# Patient Record
Sex: Female | Born: 1935 | Race: White | Hispanic: No | Smoking: Never smoker
Health system: Southern US, Community
[De-identification: ages and names within clinical notes are randomized; demographics above are authoritative.]

## PROBLEM LIST (undated history)

## (undated) DIAGNOSIS — F32A Depression, unspecified: Secondary | ICD-10-CM

## (undated) DIAGNOSIS — E119 Type 2 diabetes mellitus without complications: Secondary | ICD-10-CM

## (undated) DIAGNOSIS — K219 Gastro-esophageal reflux disease without esophagitis: Secondary | ICD-10-CM

## (undated) DIAGNOSIS — N289 Disorder of kidney and ureter, unspecified: Secondary | ICD-10-CM

## (undated) DIAGNOSIS — M810 Age-related osteoporosis without current pathological fracture: Secondary | ICD-10-CM

## (undated) DIAGNOSIS — M1990 Unspecified osteoarthritis, unspecified site: Secondary | ICD-10-CM

## (undated) DIAGNOSIS — F329 Major depressive disorder, single episode, unspecified: Secondary | ICD-10-CM

## (undated) DIAGNOSIS — E785 Hyperlipidemia, unspecified: Secondary | ICD-10-CM

## (undated) DIAGNOSIS — I10 Essential (primary) hypertension: Secondary | ICD-10-CM

## (undated) DIAGNOSIS — C801 Malignant (primary) neoplasm, unspecified: Secondary | ICD-10-CM

## (undated) HISTORY — DX: Unspecified osteoarthritis, unspecified site: M19.90

## (undated) HISTORY — DX: Depression, unspecified: F32.A

## (undated) HISTORY — DX: Hyperlipidemia, unspecified: E78.5

## (undated) HISTORY — DX: Major depressive disorder, single episode, unspecified: F32.9

## (undated) HISTORY — DX: Type 2 diabetes mellitus without complications: E11.9

## (undated) HISTORY — PX: TOTAL HIP ARTHROPLASTY: SHX124

## (undated) HISTORY — DX: Gastro-esophageal reflux disease without esophagitis: K21.9

---

## 2003-11-19 ENCOUNTER — Ambulatory Visit: Payer: Self-pay | Admitting: *Deleted

## 2004-10-09 ENCOUNTER — Ambulatory Visit: Payer: Self-pay | Admitting: Ophthalmology

## 2004-10-14 ENCOUNTER — Ambulatory Visit: Payer: Self-pay | Admitting: Ophthalmology

## 2004-11-23 ENCOUNTER — Ambulatory Visit: Payer: Self-pay | Admitting: Nurse Practitioner

## 2004-11-27 ENCOUNTER — Ambulatory Visit: Payer: Self-pay | Admitting: Nurse Practitioner

## 2005-12-14 ENCOUNTER — Ambulatory Visit: Payer: Self-pay | Admitting: *Deleted

## 2006-08-15 ENCOUNTER — Ambulatory Visit: Payer: Self-pay | Admitting: *Deleted

## 2006-08-31 ENCOUNTER — Ambulatory Visit: Payer: Self-pay | Admitting: *Deleted

## 2006-09-21 ENCOUNTER — Ambulatory Visit: Payer: Self-pay | Admitting: *Deleted

## 2007-01-17 ENCOUNTER — Ambulatory Visit: Payer: Self-pay | Admitting: *Deleted

## 2007-12-25 ENCOUNTER — Ambulatory Visit: Payer: Self-pay | Admitting: Nephrology

## 2008-02-27 ENCOUNTER — Ambulatory Visit: Payer: Self-pay | Admitting: Family Medicine

## 2008-09-06 ENCOUNTER — Inpatient Hospital Stay: Payer: Self-pay | Admitting: Specialist

## 2008-09-13 ENCOUNTER — Encounter: Payer: Self-pay | Admitting: Internal Medicine

## 2008-09-15 ENCOUNTER — Encounter: Payer: Self-pay | Admitting: Internal Medicine

## 2009-03-07 ENCOUNTER — Ambulatory Visit: Payer: Self-pay | Admitting: Family Medicine

## 2009-09-13 ENCOUNTER — Inpatient Hospital Stay: Payer: Self-pay | Admitting: Internal Medicine

## 2010-02-19 ENCOUNTER — Ambulatory Visit: Payer: Self-pay | Admitting: Family Medicine

## 2010-03-11 ENCOUNTER — Ambulatory Visit: Payer: Self-pay | Admitting: Family Medicine

## 2010-09-20 ENCOUNTER — Emergency Department: Payer: Self-pay | Admitting: Internal Medicine

## 2011-04-02 ENCOUNTER — Ambulatory Visit: Payer: Self-pay | Admitting: Family Medicine

## 2013-01-21 ENCOUNTER — Emergency Department: Payer: Self-pay | Admitting: Emergency Medicine

## 2013-02-23 ENCOUNTER — Emergency Department: Payer: Self-pay | Admitting: Emergency Medicine

## 2013-02-23 LAB — CBC
HCT: 36.3 % (ref 35.0–47.0)
HGB: 11.9 g/dL — ABNORMAL LOW (ref 12.0–16.0)
MCH: 31.2 pg (ref 26.0–34.0)
MCHC: 32.8 g/dL (ref 32.0–36.0)
MCV: 95 fL (ref 80–100)
PLATELETS: 215 10*3/uL (ref 150–440)
RBC: 3.82 10*6/uL (ref 3.80–5.20)
RDW: 13.9 % (ref 11.5–14.5)
WBC: 13.1 10*3/uL — AB (ref 3.6–11.0)

## 2013-02-23 LAB — BASIC METABOLIC PANEL
ANION GAP: 7 (ref 7–16)
BUN: 31 mg/dL — ABNORMAL HIGH (ref 7–18)
CHLORIDE: 107 mmol/L (ref 98–107)
CREATININE: 1.35 mg/dL — AB (ref 0.60–1.30)
Calcium, Total: 8.6 mg/dL (ref 8.5–10.1)
Co2: 26 mmol/L (ref 21–32)
EGFR (African American): 42 — ABNORMAL LOW
EGFR (Non-African Amer.): 36 — ABNORMAL LOW
Glucose: 225 mg/dL — ABNORMAL HIGH (ref 65–99)
Osmolality: 293 (ref 275–301)
Potassium: 4.1 mmol/L (ref 3.5–5.1)
SODIUM: 140 mmol/L (ref 136–145)

## 2013-02-23 LAB — TROPONIN I: Troponin-I: <0.02 ng/mL

## 2016-09-16 ENCOUNTER — Other Ambulatory Visit: Payer: Self-pay | Admitting: Family Medicine

## 2016-09-16 DIAGNOSIS — M810 Age-related osteoporosis without current pathological fracture: Secondary | ICD-10-CM

## 2016-10-28 ENCOUNTER — Other Ambulatory Visit: Payer: Self-pay

## 2016-11-22 ENCOUNTER — Encounter: Payer: Self-pay | Admitting: Radiology

## 2016-11-22 ENCOUNTER — Ambulatory Visit
Admission: RE | Admit: 2016-11-22 | Discharge: 2016-11-22 | Disposition: A | Discharge status: Home / Self Care | Payer: Medicare Other | Source: Ambulatory Visit | Attending: Family Medicine | Admitting: Family Medicine

## 2016-11-22 DIAGNOSIS — M47816 Spondylosis without myelopathy or radiculopathy, lumbar region: Secondary | ICD-10-CM | POA: Diagnosis not present

## 2016-11-22 DIAGNOSIS — M4186 Other forms of scoliosis, lumbar region: Secondary | ICD-10-CM | POA: Insufficient documentation

## 2016-11-22 DIAGNOSIS — Z13820 Encounter for screening for osteoporosis: Secondary | ICD-10-CM | POA: Insufficient documentation

## 2016-11-22 DIAGNOSIS — M810 Age-related osteoporosis without current pathological fracture: Secondary | ICD-10-CM | POA: Diagnosis not present

## 2017-08-22 ENCOUNTER — Emergency Department
Admission: EM | Admit: 2017-08-22 | Discharge: 2017-08-23 | Disposition: A | Discharge status: Home / Self Care | Payer: Medicare Other | Source: Home / Self Care

## 2017-08-22 ENCOUNTER — Other Ambulatory Visit: Payer: Self-pay

## 2017-08-22 ENCOUNTER — Emergency Department: Payer: Medicare Other

## 2017-08-22 ENCOUNTER — Encounter: Payer: Self-pay | Admitting: *Deleted

## 2017-08-22 DIAGNOSIS — Y939 Activity, unspecified: Secondary | ICD-10-CM

## 2017-08-22 DIAGNOSIS — Y998 Other external cause status: Secondary | ICD-10-CM

## 2017-08-22 DIAGNOSIS — S42291A Other displaced fracture of upper end of right humerus, initial encounter for closed fracture: Secondary | ICD-10-CM | POA: Insufficient documentation

## 2017-08-22 DIAGNOSIS — J189 Pneumonia, unspecified organism: Secondary | ICD-10-CM | POA: Diagnosis not present

## 2017-08-22 DIAGNOSIS — Y929 Unspecified place or not applicable: Secondary | ICD-10-CM | POA: Insufficient documentation

## 2017-08-22 DIAGNOSIS — R0602 Shortness of breath: Secondary | ICD-10-CM | POA: Diagnosis not present

## 2017-08-22 DIAGNOSIS — W010XXA Fall on same level from slipping, tripping and stumbling without subsequent striking against object, initial encounter: Secondary | ICD-10-CM | POA: Insufficient documentation

## 2017-08-22 NOTE — ED Triage Notes (Signed)
Pt to triage via wheelchair   Pt stood up and fell.  Pt has right upper arm pain.  No loc no vomiting. Pt alert.  Speech clear.

## 2017-08-23 MED ORDER — ACETAMINOPHEN 325 MG PO TABS
650.0000 mg | ORAL_TABLET | Freq: Once | ORAL | Status: AC
  Administered 2017-08-23: 650 mg via ORAL
  Filled 2017-08-23: qty 2

## 2017-08-23 NOTE — Discharge Instructions (Signed)
Please call and schedule an appointment with Dr. Sabra Heck. Take Tylenol every 6 hours as needed for pain. Wear the sling until the follow-up appointment with orthopedics. Return to the emergency department for symptoms that change or worsen if you are unable to schedule an appointment with orthopedics or primary care provider.

## 2017-08-23 NOTE — ED Provider Notes (Signed)
[facility] Emergency Department Provider Note ____________________________________________  Time seen: Approximately 12:34 AM  I have reviewed the triage vital signs and the nursing notes.   HISTORY  Chief Complaint Arm Injury    HPI Katelyn Gregory is a 84 y.o. female who presents to the emergency department for evaluation and treatment of right upper arm pain after mechanical, non-syncopal fall.  She states that she stood up, tripped, and fell.  She denies loss of consciousness.  She has had no vomiting or other symptoms of concern.  No alleviating measures attempted prior to arrival.  No past medical history on file.  There are no active problems to display for this patient.  Prior to Admission medications   Not on File    Allergies Penicillins  No family history on file.  Social History Social History   Tobacco Use  . Smoking status: Never Smoker  . Smokeless tobacco: Never Used  Substance Use Topics  . Alcohol use: Never    Frequency: Never  . Drug use: Never    Review of Systems Constitutional: Negative for fever. Cardiovascular: Negative for chest pain. Respiratory: Negative for shortness of breath. Musculoskeletal: Positive for right forearm pain. Skin: Positive for ecchymosis Neurological: Negative for decrease in sensation  ____________________________________________   PHYSICAL EXAM:  VITAL SIGNS: ED Triage Vitals  Enc Vitals Group     BP 08/22/17 2311 (!) 116/54     Pulse Rate 08/22/17 2311 83     Resp 08/22/17 2311 18     Temp 08/22/17 2311 98 F (36.7 C)     Temp Source 08/22/17 2311 Oral     SpO2 08/22/17 2311 97 %     Weight 08/22/17 2313 125 lb (56.7 kg)     Height 08/22/17 2313 5' (1.524 m)     Head Circumference --      Peak Flow --      Pain Score 08/22/17 2313 8     Pain Loc --      Pain Edu? --      Excl. in [place]? --     Constitutional: Alert and oriented. Well appearing and in no acute  distress. Eyes: Conjunctivae are clear without discharge or drainage Head: Atraumatic Neck: Supple.  No focal midline tenderness on exam. Respiratory: No cough. Respirations are even and unlabored. Musculoskeletal: Focal tenderness of the right upper extremity over the proximal humerus.  Skin is intact. Neurologic: Patient is neurovascularly intact, specifically the right hand and upper extremity. Skin: Ecchymosis is noted over the right upper extremity as well as scattered over bilateral extremities.  There is no open wound or lesion. Psychiatric: Affect and behavior are appropriate.  ____________________________________________   LABS (all labs ordered are listed, but only abnormal results are displayed)  Labs Reviewed - No data to display ____________________________________________  RADIOLOGY  Highly comminuted upper humerus shaft fracture with displacement per radiology. ____________________________________________   PROCEDURES  Procedures  ____________________________________________   INITIAL IMPRESSION / ASSESSMENT AND PLAN / ED COURSE  Katelyn Gregory is a 84 y.o. who presents to the emergency department for treatment and evaluation of right upper extremity pain after mechanical, non-syncopal fall in her home.  She will be placed in a sling and will follow up with orthopedics.  Her family will call tomorrow to schedule that appointment.  While here, she was able to stand and bear weight and denied any pain in her lower extremities or hips.  Family is concerned about her ability to ambulate  at home as she is dependent upon her walker.  They were encouraged to use her wheelchair and a wide-based cane.  There were encouraged to return to the emergency department for symptoms of concern if unable to schedule an appointment with her primary care provider or the orthopedist.   Medications  acetaminophen (TYLENOL) tablet 650 mg (has no administration in time range)     Pertinent labs & imaging results that were available during my care of the patient were reviewed by me and considered in my medical decision making (see chart for details).  _________________________________________   FINAL CLINICAL IMPRESSION(S) / ED DIAGNOSES  Final diagnoses:  Other closed displaced fracture of proximal end of right humerus, initial encounter    ED Discharge Orders    None       If controlled substance prescribed during this visit, 12 month history viewed on the [place] prior to issuing an initial prescription for Schedule II or III opiod.    Victorino Dike, FNP 08/23/17 0147    Darel Hong, MD 08/26/17 1410

## 2017-08-23 NOTE — ED Notes (Signed)
Stood pt up to be sure she could stand with no pain. Pt stated that only her arm hurts

## 2017-08-24 ENCOUNTER — Encounter: Payer: Self-pay | Admitting: Emergency Medicine

## 2017-08-24 ENCOUNTER — Inpatient Hospital Stay
Admission: EM | Admit: 2017-08-24 | Discharge: 2017-08-27 | DRG: 194 | Disposition: A | Discharge status: Skilled Nursing Facility | Payer: Medicare Other | Attending: Internal Medicine | Admitting: Internal Medicine

## 2017-08-24 ENCOUNTER — Emergency Department: Payer: Medicare Other

## 2017-08-24 DIAGNOSIS — D631 Anemia in chronic kidney disease: Secondary | ICD-10-CM | POA: Diagnosis present

## 2017-08-24 DIAGNOSIS — Z880 Allergy status to penicillin: Secondary | ICD-10-CM | POA: Diagnosis not present

## 2017-08-24 DIAGNOSIS — D509 Iron deficiency anemia, unspecified: Secondary | ICD-10-CM | POA: Diagnosis present

## 2017-08-24 DIAGNOSIS — Z7982 Long term (current) use of aspirin: Secondary | ICD-10-CM

## 2017-08-24 DIAGNOSIS — M810 Age-related osteoporosis without current pathological fracture: Secondary | ICD-10-CM | POA: Diagnosis present

## 2017-08-24 DIAGNOSIS — K219 Gastro-esophageal reflux disease without esophagitis: Secondary | ICD-10-CM | POA: Diagnosis present

## 2017-08-24 DIAGNOSIS — S42201A Unspecified fracture of upper end of right humerus, initial encounter for closed fracture: Secondary | ICD-10-CM | POA: Diagnosis present

## 2017-08-24 DIAGNOSIS — Z7984 Long term (current) use of oral hypoglycemic drugs: Secondary | ICD-10-CM

## 2017-08-24 DIAGNOSIS — N179 Acute kidney failure, unspecified: Secondary | ICD-10-CM | POA: Diagnosis present

## 2017-08-24 DIAGNOSIS — N183 Chronic kidney disease, stage 3 (moderate): Secondary | ICD-10-CM | POA: Diagnosis present

## 2017-08-24 DIAGNOSIS — Z79899 Other long term (current) drug therapy: Secondary | ICD-10-CM | POA: Diagnosis not present

## 2017-08-24 DIAGNOSIS — Z85828 Personal history of other malignant neoplasm of skin: Secondary | ICD-10-CM

## 2017-08-24 DIAGNOSIS — Z96649 Presence of unspecified artificial hip joint: Secondary | ICD-10-CM | POA: Diagnosis present

## 2017-08-24 DIAGNOSIS — W19XXXA Unspecified fall, initial encounter: Secondary | ICD-10-CM | POA: Diagnosis present

## 2017-08-24 DIAGNOSIS — Y92009 Unspecified place in unspecified non-institutional (private) residence as the place of occurrence of the external cause: Secondary | ICD-10-CM

## 2017-08-24 DIAGNOSIS — J189 Pneumonia, unspecified organism: Secondary | ICD-10-CM | POA: Diagnosis present

## 2017-08-24 DIAGNOSIS — R627 Adult failure to thrive: Secondary | ICD-10-CM | POA: Diagnosis present

## 2017-08-24 DIAGNOSIS — E1122 Type 2 diabetes mellitus with diabetic chronic kidney disease: Secondary | ICD-10-CM | POA: Diagnosis present

## 2017-08-24 DIAGNOSIS — I129 Hypertensive chronic kidney disease with stage 1 through stage 4 chronic kidney disease, or unspecified chronic kidney disease: Secondary | ICD-10-CM | POA: Diagnosis present

## 2017-08-24 DIAGNOSIS — R0602 Shortness of breath: Secondary | ICD-10-CM | POA: Diagnosis present

## 2017-08-24 DIAGNOSIS — S42309A Unspecified fracture of shaft of humerus, unspecified arm, initial encounter for closed fracture: Secondary | ICD-10-CM

## 2017-08-24 HISTORY — DX: Disorder of kidney and ureter, unspecified: N28.9

## 2017-08-24 HISTORY — DX: Essential (primary) hypertension: I10

## 2017-08-24 HISTORY — DX: Age-related osteoporosis without current pathological fracture: M81.0

## 2017-08-24 HISTORY — DX: Malignant (primary) neoplasm, unspecified: C80.1

## 2017-08-24 LAB — CBC
HCT: 31.5 % — ABNORMAL LOW (ref 35.0–47.0)
HEMOGLOBIN: 10.6 g/dL — AB (ref 12.0–16.0)
MCH: 32.6 pg (ref 26.0–34.0)
MCHC: 33.5 g/dL (ref 32.0–36.0)
MCV: 97.3 fL (ref 80.0–100.0)
PLATELETS: 223 10*3/uL (ref 150–440)
RBC: 3.24 MIL/uL — AB (ref 3.80–5.20)
RDW: 13.1 % (ref 11.5–14.5)
WBC: 19.1 10*3/uL — AB (ref 3.6–11.0)

## 2017-08-24 LAB — BASIC METABOLIC PANEL
Anion gap: 13 (ref 5–15)
BUN: 78 mg/dL — ABNORMAL HIGH (ref 8–23)
CALCIUM: 8.7 mg/dL — AB (ref 8.9–10.3)
CO2: 25 mmol/L (ref 22–32)
CREATININE: 3.18 mg/dL — AB (ref 0.44–1.00)
Chloride: 106 mmol/L (ref 98–111)
GFR, EST AFRICAN AMERICAN: 14 mL/min — AB (ref >60)
GFR, EST NON AFRICAN AMERICAN: 12 mL/min — AB (ref >60)
Glucose, Bld: 215 mg/dL — ABNORMAL HIGH (ref 70–99)
Potassium: 4.1 mmol/L (ref 3.5–5.1)
SODIUM: 144 mmol/L (ref 135–145)

## 2017-08-24 LAB — GLUCOSE, CAPILLARY: Glucose-Capillary: 194 mg/dL — ABNORMAL HIGH (ref 70–99)

## 2017-08-24 LAB — TROPONIN I

## 2017-08-24 MED ORDER — PANTOPRAZOLE SODIUM 40 MG PO TBEC
40.0000 mg | DELAYED_RELEASE_TABLET | Freq: Every day | ORAL | Status: DC
  Administered 2017-08-25 – 2017-08-27 (×3): 40 mg via ORAL
  Filled 2017-08-24 (×3): qty 1

## 2017-08-24 MED ORDER — ACETAMINOPHEN 325 MG PO TABS
650.0000 mg | ORAL_TABLET | Freq: Four times a day (QID) | ORAL | Status: DC | PRN
  Administered 2017-08-25 – 2017-08-26 (×2): 650 mg via ORAL
  Filled 2017-08-24 (×2): qty 2

## 2017-08-24 MED ORDER — LEVOFLOXACIN IN D5W 500 MG/100ML IV SOLN: 500.0000 mg | INTRAVENOUS | Status: DC

## 2017-08-24 MED ORDER — SENNOSIDES-DOCUSATE SODIUM 8.6-50 MG PO TABS: 1.0000 | ORAL_TABLET | Freq: Every evening | ORAL | Status: DC | PRN

## 2017-08-24 MED ORDER — ASPIRIN EC 81 MG PO TBEC
81.0000 mg | DELAYED_RELEASE_TABLET | Freq: Every day | ORAL | Status: DC
  Administered 2017-08-25 – 2017-08-26 (×2): 81 mg via ORAL
  Filled 2017-08-24: qty 1

## 2017-08-24 MED ORDER — INSULIN ASPART 100 UNIT/ML ~~LOC~~ SOLN
0.0000 [IU] | Freq: Every day | SUBCUTANEOUS | Status: DC
Start: 2017-08-24 — End: 2017-08-27

## 2017-08-24 MED ORDER — GLUCOSAMINE-CHONDROITIN-MSM 500-200-150 MG PO TABS: 1.0000 | ORAL_TABLET | Freq: Every day | ORAL | Status: DC

## 2017-08-24 MED ORDER — LORATADINE 10 MG PO TABS
10.0000 mg | ORAL_TABLET | Freq: Every day | ORAL | Status: DC
  Administered 2017-08-25 – 2017-08-27 (×3): 10 mg via ORAL
  Filled 2017-08-24 (×3): qty 1

## 2017-08-24 MED ORDER — ONDANSETRON HCL 4 MG PO TABS
4.0000 mg | ORAL_TABLET | Freq: Four times a day (QID) | ORAL | Status: DC | PRN
  Administered 2017-08-26 – 2017-08-27 (×2): 4 mg via ORAL
  Filled 2017-08-24 (×2): qty 1

## 2017-08-24 MED ORDER — HEPARIN SODIUM (PORCINE) 5000 UNIT/ML IJ SOLN
5000.0000 [IU] | Freq: Three times a day (TID) | INTRAMUSCULAR | Status: DC
Start: 2017-08-24 — End: 2017-08-25
  Administered 2017-08-24 – 2017-08-25 (×2): 5000 [IU] via SUBCUTANEOUS
  Filled 2017-08-24 (×2): qty 1

## 2017-08-24 MED ORDER — HYDROCODONE-ACETAMINOPHEN 5-325 MG PO TABS
1.0000 | ORAL_TABLET | ORAL | Status: DC | PRN
  Administered 2017-08-24: 1 via ORAL
  Filled 2017-08-24: qty 1

## 2017-08-24 MED ORDER — PAROXETINE HCL 20 MG PO TABS
20.0000 mg | ORAL_TABLET | Freq: Every day | ORAL | Status: DC
  Administered 2017-08-25 – 2017-08-27 (×3): 20 mg via ORAL
  Filled 2017-08-24 (×3): qty 1

## 2017-08-24 MED ORDER — ACETAMINOPHEN 650 MG RE SUPP: 650.0000 mg | Freq: Four times a day (QID) | RECTAL | Status: DC | PRN

## 2017-08-24 MED ORDER — LEVOFLOXACIN IN D5W 750 MG/150ML IV SOLN
750.0000 mg | Freq: Once | INTRAVENOUS | Status: AC
  Administered 2017-08-25: 750 mg via INTRAVENOUS
  Filled 2017-08-24: qty 150

## 2017-08-24 MED ORDER — ADULT MULTIVITAMIN W/MINERALS CH
1.0000 | ORAL_TABLET | Freq: Every day | ORAL | Status: DC
  Administered 2017-08-25 – 2017-08-27 (×3): 1 via ORAL
  Filled 2017-08-24 (×3): qty 1

## 2017-08-24 MED ORDER — GLIPIZIDE ER 5 MG PO TB24
5.0000 mg | ORAL_TABLET | Freq: Two times a day (BID) | ORAL | Status: DC
  Administered 2017-08-24 – 2017-08-26 (×5): 5 mg via ORAL
  Filled 2017-08-24 (×6): qty 1

## 2017-08-24 MED ORDER — SODIUM CHLORIDE 0.9 % IV BOLUS
1000.0000 mL | Freq: Once | INTRAVENOUS | Status: AC
  Administered 2017-08-24: 1000 mL via INTRAVENOUS

## 2017-08-24 MED ORDER — ONDANSETRON HCL 4 MG/2ML IJ SOLN: 4.0000 mg | Freq: Four times a day (QID) | INTRAMUSCULAR | Status: DC | PRN

## 2017-08-24 MED ORDER — CALCIUM CARBONATE-VITAMIN D 500-200 MG-UNIT PO TABS
1.0000 | ORAL_TABLET | Freq: Every day | ORAL | Status: DC
  Administered 2017-08-25 – 2017-08-27 (×3): 1 via ORAL
  Filled 2017-08-24 (×3): qty 1

## 2017-08-24 MED ORDER — SIMVASTATIN 20 MG PO TABS
20.0000 mg | ORAL_TABLET | Freq: Every evening | ORAL | Status: DC
  Administered 2017-08-24 – 2017-08-26 (×3): 20 mg via ORAL
  Filled 2017-08-24 (×3): qty 1

## 2017-08-24 MED ORDER — INSULIN ASPART 100 UNIT/ML ~~LOC~~ SOLN
0.0000 [IU] | Freq: Three times a day (TID) | SUBCUTANEOUS | Status: DC
  Administered 2017-08-25 (×2): 2 [IU] via SUBCUTANEOUS
  Administered 2017-08-26: 5 [IU] via SUBCUTANEOUS
  Administered 2017-08-26: 3 [IU] via SUBCUTANEOUS
  Administered 2017-08-27: 5 [IU] via SUBCUTANEOUS
  Filled 2017-08-24 (×4): qty 1

## 2017-08-24 MED ORDER — SODIUM CHLORIDE 0.9 % IV SOLN
INTRAVENOUS | Status: DC
  Administered 2017-08-24 – 2017-08-26 (×3): via INTRAVENOUS

## 2017-08-24 NOTE — ED Notes (Signed)
Attempted to call report at this time. Was unable to give report. Left name and number, awaiting call back.

## 2017-08-24 NOTE — ED Notes (Signed)
Discussed patient's results with Charge, RN.  Charge RN acknowledged.

## 2017-08-24 NOTE — Progress Notes (Signed)
Advanced care plan.  Purpose of the Encounter: CODE STATUS  Parties in Attendance:Patient  Patient's Decision Capacity:Good  Subjective/Patient's story: Presented for fall and pain in right arm Shortness of breath on exertion  Objective/Medical story Has pneumonia and a right humerus fracture   Goals of care determination:  Advance care directives and goals of care discussed with patient and family Patient wants everything done which includes cpr and ventilator if need arises.   CODE STATUS: Full code   Time spent discussing advanced care planning: 16 minutes

## 2017-08-24 NOTE — ED Notes (Signed)
Pt was in recliner. Shuffle walked to stretcher.   Family member states fell on Monday and broke R humeral head. Denies being on narcotic pain medicine. States since fall she has been weak, incontinent. Asking to be placed in rehab d/t not being able to take care of self. States normally independent. States has been walking with walker at home but gets SOB. Lives with son who has CA and is unable to take care of her. Daughter and son-in-law here with pt. They state they live an hour away and are unable to care for her either. Pt is alert and oriented. Has on a brief. No distress noted at this time. Covered with warm blankets.

## 2017-08-24 NOTE — ED Notes (Signed)
X-ray at bedside

## 2017-08-24 NOTE — ED Provider Notes (Signed)
[facility] Emergency Department Provider Note ____________________________________________   First MD Initiated Contact with Patient 08/24/17 1627     (approximate)  I have reviewed the triage vital signs and the nursing notes.   HISTORY  Chief Complaint Placement  HPI Katelyn Gregory is a 84 y.o. female with history of a recent right-sided humerus fracture was presenting with failure to thrive.  Patient has had increased weakness since the fracture, this past Monday.  Decreased p.o. intake as well as well as shortness of breath with exertion.  Denies any chest pain.  Has been wearing a sling to the right upper extremity.  Past Medical History:  Diagnosis Date  . Cancer ([place])    skin  . Diabetes mellitus without complication ([place])   . Hypertension   . Osteoporosis   . Renal disorder     There are no active problems to display for this patient.   Past Surgical History:  Procedure Laterality Date  . TOTAL HIP ARTHROPLASTY      Prior to Admission medications   Medication Sig Start Date End Date Taking? Authorizing Provider  acetaminophen (TYLENOL) 500 MG tablet Take 500-1,000 mg by mouth every 6 (six) hours as needed for mild pain.   Yes [provider]  amLODipine-benazepril (LOTREL) 5-20 MG capsule Take 1 capsule by mouth daily. 06/28/17  Yes [provider]  aspirin EC 81 MG tablet Take 81 mg by mouth daily.   Yes [provider]  calcium-vitamin D (OSCAL WITH D) 500-200 MG-UNIT tablet Take 1 tablet by mouth daily.   Yes [provider]  cetirizine (ZYRTEC) 10 MG tablet Take 10 mg by mouth daily. 06/24/17  Yes [provider]  esomeprazole (NEXIUM) 40 MG capsule Take 40 mg by mouth daily. 07/18/17  Yes [provider]  furosemide (LASIX) 40 MG tablet Take 40-80 mg by mouth See admin instructions. Alternate 40 mg and 80 mg daily. Take 40 mg by mouth every other day. Take 80 mg by mouth every other  day. 08/12/17  Yes [provider]  glipiZIDE (GLUCOTROL XL) 5 MG 24 hr tablet Take 5-10 mg by mouth 2 (two) times daily. Take 5 mg by mouth in the morning. Take 10 mg by mouth in the evening. 08/18/17  Yes [provider]  Glucosamine-Chondroitin-MSM 500-200-150 MG TABS Take 1 tablet by mouth daily.   Yes [provider]  Multiple Vitamin (MULTIVITAMIN) tablet Take 1 tablet by mouth daily.   Yes [provider]  PARoxetine (PAXIL) 20 MG tablet Take 20 mg by mouth daily. 05/31/17  Yes [provider]  simvastatin (ZOCOR) 20 MG tablet Take 20 mg by mouth every evening. for cholesterol 06/28/17  Yes [provider]    Allergies Penicillins  No family history on file.  Social History Social History   Tobacco Use  . Smoking status: Never Smoker  . Smokeless tobacco: Never Used  Substance Use Topics  . Alcohol use: Never    Frequency: Never  . Drug use: Never    Review of Systems  Constitutional: No fever/chills Eyes: No visual changes. ENT: No sore throat. Cardiovascular: Denies chest pain. Respiratory: as above. Gastrointestinal: No abdominal pain.  No nausea, no vomiting.  No diarrhea.  No constipation. Genitourinary: Negative for dysuria. Musculoskeletal: Negative for back pain. Skin: Negative for rash. Neurological: Negative for headaches, focal weakness or numbness.   ____________________________________________   PHYSICAL EXAM:  VITAL SIGNS: ED Triage Vitals [08/24/17 1330]  Enc Vitals Group  BP (!) 101/46     Pulse Rate 91     Resp 17     Temp 97.7 F (36.5 C)     Temp Source Oral     SpO2 97 %     Weight 125 lb (56.7 kg)     Height 5\' 1"  (1.549 m)     Head Circumference      Peak Flow      Pain Score 6     Pain Loc      Pain Edu?      Excl. in [place]?     Constitutional: Alert and oriented. Well appearing and in no acute distress. Eyes: Conjunctivae are normal.  Head: Atraumatic. Nose: No  congestion/rhinnorhea. Mouth/Throat: Mucous membranes are moist.  Neck: No stridor.   Cardiovascular: Normal rate, regular rhythm. Grossly normal heart sounds.   Respiratory: Normal respiratory effort.  No retractions. Lungs CTAB. Gastrointestinal: Soft and nontender. No distention. No CVA tenderness. Musculoskeletal: No lower extremity tenderness nor edema.  No joint effusions.  Patient wearing right upper extremity sling.  Sensation to light touch is intact to the right hand as well as intact strength.  Ecchymosis overlying the right hand.  Neurologic:  Normal speech and language. No gross focal neurologic deficits are appreciated. Skin:  Skin is warm, dry and intact. No rash noted. Psychiatric: Mood and affect are normal. Speech and behavior are normal.  ____________________________________________   LABS (all labs ordered are listed, but only abnormal results are displayed)  Labs Reviewed  BASIC METABOLIC PANEL - Abnormal; Notable for the following components:      Result Value   Glucose, Bld 215 (*)    BUN 78 (*)    Creatinine, Ser 3.18 (*)    Calcium 8.7 (*)    GFR calc non Af Amer 12 (*)    GFR calc Af Amer 14 (*)    All other components within normal limits  CBC - Abnormal; Notable for the following components:   WBC 19.1 (*)    RBC 3.24 (*)    Hemoglobin 10.6 (*)    HCT 31.5 (*)    All other components within normal limits  URINALYSIS, COMPLETE (UACMP) WITH MICROSCOPIC  TROPONIN I  CBG MONITORING, ED   ____________________________________________  EKG  ED ECG REPORT I, Doran Stabler, the attending physician, personally viewed and interpreted this ECG.   Date: 08/24/2017  EKG Time: 1345  Rate: 87  Rhythm: normal sinus rhythm  Axis: Normal  Intervals:none  ST&T Change: No ST segment elevation or depression.  No abnormal T wave inversion.  ____________________________________________  RADIOLOGY  Pending chest  x-ray ____________________________________________   PROCEDURES  Procedure(s) performed:   Procedures  Critical Care performed:   ____________________________________________   INITIAL IMPRESSION / ASSESSMENT AND PLAN / ED COURSE  Pertinent labs & imaging results that were available during my care of the patient were reviewed by me and considered in my medical decision making (see chart for details).  DDX: Dehydration, kidney failure, urinary tract infection, pneumonia, failure to thrive As part of my medical decision making, I reviewed the following data within the electronic MEDICAL RECORD NUMBER Notes from prior ED visits  ----------------------------------------- 5:21 PM on 08/24/2017 -----------------------------------------  Patient with acute renal failure.  Pending urinalysis as well as chest x-ray.  Patient will be admitted to the hospital.  Signed out to Dr. Estanislado Pandy. ____________________________________________   FINAL CLINICAL IMPRESSION(S) / ED DIAGNOSES  Acute renal failure.  Failure to thrive.  NEW MEDICATIONS STARTED DURING THIS VISIT:  New Prescriptions   No medications on file     Note:  This document was prepared using Dragon voice recognition software and may include unintentional dictation errors.     Orbie Pyo, MD 08/24/17 [phone]

## 2017-08-24 NOTE — ED Notes (Signed)
This RN took pt up to 1A to room 136 and gave bedside report to [employer].

## 2017-08-24 NOTE — Progress Notes (Signed)
Pt admitted from ED from home with family to room 136 via stretcher without incident per MD order. Pt is alert and oriented x 3. Pt denies pain. No visible s/sx of distress and no c/o such. Vital signs stable. Rept received from [facility] from ED. Rept given to [employer] on night shift.

## 2017-08-24 NOTE — Progress Notes (Signed)
Pharmacy Antibiotic Note  Katelyn Gregory is a 84 y.o. female admitted on 08/24/2017 with pneumonia. Pharmacy has been consulted for Levaquin dosing.  Plan: Levaquin 750 mg iv once followed by 500 mg iv q 48 hours.   Height: 5\' 1"  (154.9 cm) Weight: 125 lb (56.7 kg) IBW/kg (Calculated) : 47.8  Temp (24hrs), Avg:97.7 F (36.5 C), Min:97.7 F (36.5 C), Max:97.7 F (36.5 C)  Recent Labs  Lab 08/24/17 1340  WBC 19.1*  CREATININE 3.18*    Estimated Creatinine Clearance: 9.2 mL/min (A) (by C-G formula based on SCr of 3.18 mg/dL (H)).    Allergies  Allergen Reactions  . Penicillins Other (See Comments)    Has patient had a PCN reaction causing immediate rash, facial/tongue/throat swelling, SOB or lightheadedness with hypotension: Unknown Has patient had a PCN reaction causing severe rash involving mucus membranes or skin necrosis: Unknown Has patient had a PCN reaction that required hospitalization: Unknown Has patient had a PCN reaction occurring within the last 10 years: Unknown If all of the above answers are "NO", then may proceed with Cephalosporin use.     Antimicrobials this admission: Levaquin 7/10 >>  Dose adjustments this admission:   Microbiology results:   Thank you for allowing pharmacy to be a part of this patient's care.  Ulice Dash D 08/24/2017 6:18 PM

## 2017-08-24 NOTE — ED Triage Notes (Signed)
Patient presents to ED via POV from home with family. Patient lives with her son who is sick with cancer and who is unable to care of her. Patient was seen recently for a fall and sustained a right humeral fracture. Family state before this she was independent but now is more weak and is unable to care for herself. Family is requesting placement to a rehab center.

## 2017-08-24 NOTE — Progress Notes (Signed)
PHARMACIST - PHYSICIAN ORDER COMMUNICATION  CONCERNING: P&T Medication Policy on Herbal Medications  DESCRIPTION:  This patient's order for:  Glucosamine-Chondroitin-MSM 500-200-150 MG TABS has been noted.  This product(s) is classified as an "herbal" or natural product. Due to a lack of definitive safety studies or FDA approval, nonstandard manufacturing practices, plus the potential risk of unknown drug-drug interactions while on inpatient medications, the Pharmacy and Therapeutics Committee does not permit the use of "herbal" or natural products of this type within [facility].   ACTION TAKEN: The pharmacy department is unable to verify this order at this time. Please reevaluate patient's clinical condition at discharge and address if the herbal or natural product(s) should be resumed at that time.  Pernell Dupre, PharmD, BCPS Clinical Pharmacist 08/24/2017 8:23 PM

## 2017-08-24 NOTE — H&P (Signed)
[facility] NAME: Katelyn Gregory    MR#:  [id number]  DATE OF BIRTH:  04/17/1926  DATE OF ADMISSION:  08/24/2017  PRIMARY CARE PHYSICIAN: [facility] [place]   REQUESTING/REFERRING PHYSICIAN:   CHIEF COMPLAINT:   Chief Complaint  Patient presents with  . Placement    HISTORY OF PRESENT ILLNESS: Katelyn Gregory  is a 84 y.o. female with a known history of skin cancer, diabetes mellitus type 2, hypertension, osteoporosis presented to the emergency room for fall and pain in the right arm.  Patient's son also unable to take care of her secondary to his cancer.  Patient recently fell down accidentally and broke her right arm and currently in sling.  She has a right humerus fracture.  Patient also gets short of breath on walking.  She was evaluated in the emergency room chest x-ray revealed pneumonia.  No complaints of any chest pain.  No headache, dizziness and blurry vision.  Hospitalist service was consulted for further care.  PAST MEDICAL HISTORY:   Past Medical History:  Diagnosis Date  . Cancer ([place])    skin  . Diabetes mellitus without complication ([place])   . Hypertension   . Osteoporosis   . Renal disorder     PAST SURGICAL HISTORY:  Past Surgical History:  Procedure Laterality Date  . TOTAL HIP ARTHROPLASTY      SOCIAL HISTORY:  Social History   Tobacco Use  . Smoking status: Never Smoker  . Smokeless tobacco: Never Used  Substance Use Topics  . Alcohol use: Never    Frequency: Never    FAMILY HISTORY: No family history on file.  DRUG ALLERGIES:  Allergies  Allergen Reactions  . Penicillins Other (See Comments)    Has patient had a PCN reaction causing immediate rash, facial/tongue/throat swelling, SOB or lightheadedness with hypotension: Unknown Has patient had a PCN reaction causing severe rash involving mucus membranes or skin necrosis: Unknown Has patient had a PCN reaction that required  hospitalization: Unknown Has patient had a PCN reaction occurring within the last 10 years: Unknown If all of the above answers are "NO", then may proceed with Cephalosporin use.     REVIEW OF SYSTEMS:   CONSTITUTIONAL: No fever,has fatigue and weakness.  EYES: No blurred or double vision.  EARS, NOSE, AND THROAT: No tinnitus or ear pain.  RESPIRATORY: Has occasional cough, shortness of breath,  No wheezing or hemoptysis.  CARDIOVASCULAR: No chest pain, orthopnea, edema.  GASTROINTESTINAL: No nausea, vomiting, diarrhea or abdominal pain.  GENITOURINARY: No dysuria, hematuria.  ENDOCRINE: No polyuria, nocturia,  HEMATOLOGY: No anemia, easy bruising or bleeding SKIN: No rash or lesion. MUSCULOSKELETAL: No joint pain or arthritis.   Has pain right arm NEUROLOGIC: No tingling, numbness, weakness.  PSYCHIATRY: No anxiety or depression.   MEDICATIONS AT HOME:  Prior to Admission medications   Medication Sig Start Date End Date Taking? Authorizing Provider  acetaminophen (TYLENOL) 500 MG tablet Take 500-1,000 mg by mouth every 6 (six) hours as needed for mild pain.   Yes [provider]  amLODipine-benazepril (LOTREL) 5-20 MG capsule Take 1 capsule by mouth daily. 06/28/17  Yes [provider]  aspirin EC 81 MG tablet Take 81 mg by mouth daily.   Yes [provider]  calcium-vitamin D (OSCAL WITH D) 500-200 MG-UNIT tablet Take 1 tablet by mouth daily.   Yes [provider]  cetirizine (ZYRTEC) 10 MG tablet Take 10 mg by mouth  daily. 06/24/17  Yes [provider]  esomeprazole (NEXIUM) 40 MG capsule Take 40 mg by mouth daily. 07/18/17  Yes [provider]  furosemide (LASIX) 40 MG tablet Take 40-80 mg by mouth See admin instructions. Alternate 40 mg and 80 mg daily. Take 40 mg by mouth every other day. Take 80 mg by mouth every other day. 08/12/17  Yes [provider]  glipiZIDE (GLUCOTROL XL) 5 MG 24 hr tablet Take 5-10 mg by mouth  2 (two) times daily. Take 5 mg by mouth in the morning. Take 10 mg by mouth in the evening. 08/18/17  Yes [provider]  Glucosamine-Chondroitin-MSM 500-200-150 MG TABS Take 1 tablet by mouth daily.   Yes [provider]  Multiple Vitamin (MULTIVITAMIN) tablet Take 1 tablet by mouth daily.   Yes [provider]  PARoxetine (PAXIL) 20 MG tablet Take 20 mg by mouth daily. 05/31/17  Yes [provider]  simvastatin (ZOCOR) 20 MG tablet Take 20 mg by mouth every evening. for cholesterol 06/28/17  Yes [provider]      PHYSICAL EXAMINATION:   VITAL SIGNS: Blood pressure (!) 112/52, pulse 87, temperature 97.7 F (36.5 C), temperature source Oral, resp. rate 16, height 5\' 1"  (1.549 m), weight 56.7 kg (125 lb), SpO2 96 %.  GENERAL:  84 y.o.-year-old patient lying in the bed with no acute distress.  EYES: Pupils equal, round, reactive to light and accommodation. No scleral icterus. Extraocular muscles intact.  HEENT: Head atraumatic, normocephalic. Oropharynx and nasopharynx clear.  NECK:  Supple, no jugular venous distention. No thyroid enlargement, no tenderness.  LUNGS: Normal breath sounds bilaterally, rales heard in left lung. No use of accessory muscles of respiration.  CARDIOVASCULAR: S1, S2 normal. No murmurs, rubs, or gallops.  ABDOMEN: Soft, nontender, nondistended. Bowel sounds present. No organomegaly or mass.  EXTREMITIES: No pedal edema, cyanosis, or clubbing.  Right arm sling noted NEUROLOGIC: Cranial nerves II through XII are intact. Muscle strength 5/5 in all extremities. Sensation intact. Gait not checked.  PSYCHIATRIC: The patient is alert and oriented x 3.  SKIN: No obvious rash, lesion, or ulcer.   LABORATORY PANEL:   CBC Recent Labs  Lab 08/24/17 1340  WBC 19.1*  HGB 10.6*  HCT 31.5*  PLT 223  MCV 97.3  MCH 32.6  MCHC 33.5  RDW 13.1    ------------------------------------------------------------------------------------------------------------------  Chemistries  Recent Labs  Lab 08/24/17 1340  NA 144  K 4.1  CL 106  CO2 25  GLUCOSE 215*  BUN 78*  CREATININE 3.18*  CALCIUM 8.7*   ------------------------------------------------------------------------------------------------------------------ estimated creatinine clearance is 9.2 mL/min (A) (by C-G formula based on SCr of 3.18 mg/dL (H)). ------------------------------------------------------------------------------------------------------------------ No results for input(s): TSH, T4TOTAL, T3FREE, THYROIDAB in the last 72 hours.  Invalid input(s): FREET3   Coagulation profile No results for input(s): INR, PROTIME in the last 168 hours. ------------------------------------------------------------------------------------------------------------------- No results for input(s): DDIMER in the last 72 hours. -------------------------------------------------------------------------------------------------------------------  Cardiac Enzymes Recent Labs  Lab 08/24/17 1709  TROPONINI <0.03   ------------------------------------------------------------------------------------------------------------------ Invalid input(s): POCBNP  ---------------------------------------------------------------------------------------------------------------  Urinalysis No results found for: COLORURINE, APPEARANCEUR, LABSPEC, PHURINE, GLUCOSEU, HGBUR, BILIRUBINUR, KETONESUR, PROTEINUR, UROBILINOGEN, NITRITE, LEUKOCYTESUR   RADIOLOGY: Dg Chest 1 View  Result Date: 08/24/2017 CLINICAL DATA:  Weakness, recent fall and humeral fracture. Shortness of breath with exertion. EXAM: CHEST  1 VIEW COMPARISON:  02/23/2013. FINDINGS: Trachea is midline. Heart is enlarged. Thoracic aorta is calcified. Large hiatal hernia. Opacification at the base of the left hemithorax may represent a  combination of pleural fluid and airspace consolidation. Right lung is clear. Partially imaged right humeral neck fracture. Old rib fractures. Osteopenia. IMPRESSION: 1. Opacification at the base of the left hemithorax may be due to a combination of pleural fluid and pneumonia. Followup PA and lateral chest X-ray is recommended in 3-4 weeks following trial of antibiotic therapy to ensure resolution and exclude underlying malignancy. 2.  Aortic atherosclerosis (ICD10-170.0). 3. Large hiatal hernia. Electronically Signed   By: Lorin Picket M.D.   On: 08/24/2017 17:36   Dg Humerus Right  Result Date: 08/22/2017 CLINICAL DATA:  Right upper arm pain after fall from standing EXAM: RIGHT HUMERUS - 2+ VIEW COMPARISON:  None. FINDINGS: Highly comminuted upper humerus shaft with impaction and displacement. Soft tissue contusion seen at the lateral arm. Glenohumeral osteoarthritis with marginal spurring. No visible head fracture. The elbow is located and appears intact. Osteopenia. IMPRESSION: Highly comminuted upper humerus shaft fracture with displacement Electronically Signed   By: Monte Fantasia M.D.   On: 08/22/2017 23:56    EKG: Orders placed or performed during the hospital encounter of 08/24/17  . ED EKG  . ED EKG    IMPRESSION AND PLAN:  84 year old elderly female patient with history of skin cancer, diabetes mellitus type 2, osteoporosis, chronic kidney disease presented to the emergency room for fall, pain in the right arm and shortness of breath.  -Right humerus fracture Continue sling to right arm Orthopedic surgery consultation  -Community-acquired pneumonia Start patient on Levaquin antibiotic  -Acute on chronic kidney disease IV fluid hydration and follow-up renal function  -DVT prophylaxis with subcu heparin  All the records are reviewed and case discussed with ED provider. Management plans discussed with the patient, family and they are in agreement.  CODE STATUS:Full  code    TOTAL TIME TAKING CARE OF THIS PATIENT: 51 minutes.    Saundra Shelling M.D on 08/24/2017 at 7:31 PM  Between 7am to 6pm - Pager - [phone]  After 6pm go to [email or web address] - password [email or web address] [facility]  [facility]  Office  [phone]  CC: Primary care physician; [facility]

## 2017-08-25 ENCOUNTER — Inpatient Hospital Stay: Payer: Medicare Other

## 2017-08-25 ENCOUNTER — Encounter
Admission: RE | Admit: 2017-08-25 | Discharge: 2017-08-25 | Disposition: A | Discharge status: Home / Self Care | Payer: Medicare Other | Source: Ambulatory Visit | Attending: Internal Medicine | Admitting: Internal Medicine

## 2017-08-25 ENCOUNTER — Other Ambulatory Visit: Payer: Self-pay

## 2017-08-25 LAB — BASIC METABOLIC PANEL
Anion gap: 8 (ref 5–15)
BUN: 79 mg/dL — ABNORMAL HIGH (ref 8–23)
CHLORIDE: 111 mmol/L (ref 98–111)
CO2: 26 mmol/L (ref 22–32)
Calcium: 8.1 mg/dL — ABNORMAL LOW (ref 8.9–10.3)
Creatinine, Ser: 2.88 mg/dL — ABNORMAL HIGH (ref 0.44–1.00)
GFR calc non Af Amer: 14 mL/min — ABNORMAL LOW (ref >60)
GFR, EST AFRICAN AMERICAN: 16 mL/min — AB (ref >60)
Glucose, Bld: 188 mg/dL — ABNORMAL HIGH (ref 70–99)
POTASSIUM: 3.9 mmol/L (ref 3.5–5.1)
SODIUM: 145 mmol/L (ref 135–145)

## 2017-08-25 LAB — GLUCOSE, CAPILLARY
GLUCOSE-CAPILLARY: 130 mg/dL — AB (ref 70–99)
Glucose-Capillary: 89 mg/dL (ref 70–99)
Glucose-Capillary: 123 mg/dL — ABNORMAL HIGH (ref 70–99)
Glucose-Capillary: 163 mg/dL — ABNORMAL HIGH (ref 70–99)

## 2017-08-25 LAB — URINALYSIS, COMPLETE (UACMP) WITH MICROSCOPIC
Bilirubin Urine: NEGATIVE
GLUCOSE, UA: NEGATIVE mg/dL
HGB URINE DIPSTICK: NEGATIVE
KETONES UR: NEGATIVE mg/dL
Nitrite: NEGATIVE
Protein, ur: 30 mg/dL — AB
Specific Gravity, Urine: 1.013 (ref 1.005–1.030)
WBC, UA: >50 WBC/hpf — ABNORMAL HIGH (ref 0–5)
pH: 5 (ref 5.0–8.0)

## 2017-08-25 LAB — VITAMIN B12: VITAMIN B 12: 550 pg/mL (ref 180–914)

## 2017-08-25 LAB — IRON AND TIBC
IRON: 24 ug/dL — AB (ref 28–170)
Saturation Ratios: 13 % (ref 10.4–31.8)
TIBC: 181 ug/dL — AB (ref 250–450)
UIBC: 157 ug/dL

## 2017-08-25 LAB — CBC
HEMATOCRIT: 24.5 % — AB (ref 35.0–47.0)
HEMOGLOBIN: 8.3 g/dL — AB (ref 12.0–16.0)
MCH: 33.2 pg (ref 26.0–34.0)
MCHC: 33.9 g/dL (ref 32.0–36.0)
MCV: 97.8 fL (ref 80.0–100.0)
Platelets: 152 10*3/uL (ref 150–440)
RBC: 2.51 MIL/uL — AB (ref 3.80–5.20)
RDW: 13.3 % (ref 11.5–14.5)
WBC: 9 10*3/uL (ref 3.6–11.0)

## 2017-08-25 LAB — RETICULOCYTES
RBC.: 2.53 MIL/uL — ABNORMAL LOW (ref 3.80–5.20)
Retic Count, Absolute: 50.6 10*3/uL (ref 19.0–183.0)
Retic Ct Pct: 2 % (ref 0.4–3.1)

## 2017-08-25 LAB — FOLATE: Folate: 61.2 ng/mL (ref >5.9)

## 2017-08-25 LAB — FERRITIN: Ferritin: 54 ng/mL (ref 11–307)

## 2017-08-25 MED ORDER — SODIUM CHLORIDE 0.9 % IV BOLUS: 250.0000 mL | Freq: Once | INTRAVENOUS | Status: DC

## 2017-08-25 MED ORDER — DOXYCYCLINE HYCLATE 100 MG PO TABS
100.0000 mg | ORAL_TABLET | Freq: Two times a day (BID) | ORAL | Status: DC
Start: 2017-08-25 — End: 2017-08-26
  Administered 2017-08-25 – 2017-08-26 (×3): 100 mg via ORAL
  Filled 2017-08-25 (×3): qty 1

## 2017-08-25 MED ORDER — TRAMADOL HCL 50 MG PO TABS
50.0000 mg | ORAL_TABLET | Freq: Four times a day (QID) | ORAL | Status: DC | PRN
  Administered 2017-08-25 – 2017-08-26 (×5): 50 mg via ORAL
  Filled 2017-08-25 (×5): qty 1

## 2017-08-25 MED ORDER — HEPARIN SODIUM (PORCINE) 5000 UNIT/ML IJ SOLN
5000.0000 [IU] | Freq: Two times a day (BID) | INTRAMUSCULAR | Status: DC
Start: 2017-08-25 — End: 2017-08-27
  Administered 2017-08-25 – 2017-08-27 (×5): 5000 [IU] via SUBCUTANEOUS
  Filled 2017-08-25 (×5): qty 1

## 2017-08-25 MED ORDER — ENOXAPARIN SODIUM 40 MG/0.4ML ~~LOC~~ SOLN: 40.0000 mg | SUBCUTANEOUS | Status: DC

## 2017-08-25 NOTE — Evaluation (Signed)
Physical Therapy Evaluation Patient Details Name: Katelyn Gregory MRN: [id number] DOB: 1926/09/14 Today's Date: 08/25/2017   History of Present Illness  Katelyn Gregory  is a 84 y.o. female with a known history of skin cancer, diabetes mellitus type 2, hypertension, osteoporosis presented to the emergency room for fall and pain in the right arm.  Patient's son also unable to take care of her secondary to his cancer.  Patient recently fell down accidentally and broke her right arm and currently in sling.  She has a right humerus fracture.  Patient also gets short of breath on walking.  She was evaluated in the emergency room chest x-ray revealed pneumonia.  No complaints of any chest pain.  No headache, dizziness and blurry vision.  Hospitalist service was consulted for further care.  Clinical Impression  Pt presents with generalized trunk and LE weakness and UE pain secondary to R humeral fracture. Pt is able to show LE muscle activation against gravity in all major muscle groups but is unable to perform supine to sit, sit to stand and bed to chair transfer without max assist 2+. Pt is unable to maintain standing due to fatigue. Additionally, Pt has severe forward trunk lean and spinal kyphosis. Pt reports fear of falling with all activity. Would benefit from skilled PT to address above deficits and promote optimal return to PLOF. Recommend transition to SNF upon discharge from acute hospitalization.    Follow Up Recommendations SNF    Equipment Recommendations  Wheelchair (measurements PT)    Recommendations for Other Services OT consult     Precautions / Restrictions Precautions Precautions: Fall Precaution Comments: RUE  Restrictions Weight Bearing Restrictions: Yes RUE Weight Bearing: Non weight bearing      Mobility  Bed Mobility Overal bed mobility: Needs Assistance Bed Mobility: Supine to Sit     Supine to sit: +2 for physical assistance;Max assist     General bed mobility  comments: Pt bed mobility limited by RUE immobilization as well as lack of trunk control and strength.   Transfers Overall transfer level: Needs assistance   Transfers: Sit to/from Stand;Lateral/Scoot Transfers Sit to Stand: +2 physical assistance;Max assist        Lateral/Scoot Transfers: +2 physical assistance;Max assist General transfer comment: Pt fear of falling or "being dropped" limits participation in transfers.   Ambulation/Gait             General Gait Details: Mabulaiton unable to be assessed due to pt inability to tolerate standing.   Stairs            Wheelchair Mobility    Modified Rankin (Stroke Patients Only)       Balance Overall balance assessment: Needs assistance Sitting-balance support: Bilateral upper extremity supported;Feet supported Sitting balance-Leahy Scale: Poor Sitting balance - Comments: Able to maintain sitting balance without UE support for brief periods but occasional therpaist cueing needed for maintaining position.    Standing balance support: Bilateral upper extremity supported Standing balance-Leahy Scale: Zero Standing balance comment: Pt is unable to maintain standing without therapists support. Demonstrates ant lean secondary to postural issues and decreased hip ext. Pt fear also limiting factor.                              Pertinent Vitals/Pain      Home Living Family/patient expects to be discharged to:: Private residence Living Arrangements: Children Available Help at Discharge: Family Type of Home: House Home Access: Other (  comment)(Pt states she has been instructed to not use stairs and does not do so. Does not specify number of stairs in house. )     Home Layout: Multi-level        Prior Function Level of Independence: Needs assistance   Gait / Transfers Assistance Needed: Ambulated with walker, and not able to navigate stairs without assist.   ADL's / Homemaking Assistance Needed: Son helped  with household activities        Hand Dominance   Dominant Hand: Right    Extremity/Trunk Assessment   Upper Extremity Assessment Upper Extremity Assessment: RUE deficits/detail;LUE deficits/detail RUE Deficits / Details: R humeral fracture, immobilzed in sling, expresses pain with movement  RUE: Unable to fully assess due to pain RUE Sensation: WNL LUE Deficits / Details: L UE at least 3/5 in all major muscle groups. Decreased mobility in OH reaching and shoulder abd.  LUE Sensation: WNL    Lower Extremity Assessment Lower Extremity Assessment: Generalized weakness(BLE show at least 3/5 strength in all major muscle groups, however functionally unable to stand for more than 3-5 sec before fatigue leadiing to need to sit. )    Cervical / Trunk Assessment Cervical / Trunk Assessment: Kyphotic;Other exceptions Cervical / Trunk Exceptions: Excessive T-spine and L-spine kyphosis, with compensentory c-spine lordosis. Pt unable to reposition to normal spinal posture with verbal and physical cueing.   Communication   Communication: Other (comment)(Hearing loss)  Cognition Arousal/Alertness: Awake/alert Behavior During Therapy: WFL for tasks assessed/performed Overall Cognitive Status: Within Functional Limits for tasks assessed                                        General Comments      Exercises Other Exercises Other Exercises: Bed mobility: Supine to sit-max assist 2+. Transfers: Slide transfer to recliner, max assist 2+; sit to stand max assist 2+   Assessment/Plan    PT Assessment Patient needs continued PT services  PT Problem List Decreased strength;Decreased mobility;Decreased range of motion;Decreased activity tolerance;Decreased balance;Pain       PT Treatment Interventions Therapeutic activities;Therapeutic exercise;Balance training;Functional mobility training;Patient/family education;Wheelchair mobility training;DME instruction;Gait training     PT Goals (Current goals can be found in the Care Plan section)       Frequency 7X/week   Barriers to discharge        Co-evaluation               AM-PAC PT "6 Clicks" Daily Activity  Outcome Measure Difficulty turning over in bed (including adjusting bedclothes, sheets and blankets)?: Unable Difficulty moving from lying on back to sitting on the side of the bed? : Unable Difficulty sitting down on and standing up from a chair with arms (e.g., wheelchair, bedside commode, etc,.)?: Unable Help needed moving to and from a bed to chair (including a wheelchair)?: Total Help needed walking in hospital room?: Total Help needed climbing 3-5 steps with a railing? : Total 6 Click Score: 6    End of Session Equipment Utilized During Treatment: Gait belt Activity Tolerance: Treatment limited secondary to medical complications (Comment);Patient limited by fatigue(Pt's generalized weakness and RUE immobilization limited intervention. ) Patient left: in chair;with chair alarm set;with call bell/phone within reach Nurse Communication: Mobility status(Relayed pt's transfer level and inablity to tolerate standing. ) PT Visit Diagnosis: Unsteadiness on feet (R26.81);Muscle weakness (generalized) (M62.81);Adult, failure to thrive (R62.7)    Time: 2694-8546 PT  Time Calculation (min) (ACUTE ONLY): 30 min   Charges:   PT Evaluation $PT Eval Moderate Complexity: 1 Mod PT Treatments $Therapeutic Activity: 8-22 mins   PT G Codes:        Hortencia Conradi, SPT 08/25/17,4:08 PM

## 2017-08-25 NOTE — Clinical Social Work Placement (Signed)
   CLINICAL SOCIAL WORK PLACEMENT  NOTE  Date:  08/25/2017  Patient Details  Name: Katelyn Gregory MRN: [id number] Date of Birth: 02/28/1926  Clinical Social Work is seeking post-discharge placement for this patient at the [place] level of care (*CSW will initial, date and re-position this form in  chart as items are completed):  Yes   Patient/family provided with [place] Work Department's list of facilities offering this level of care within the geographic area requested by the patient (or if unable, by the patient's family).  Yes   Patient/family informed of their freedom to choose among providers that offer the needed level of care, that participate in Medicare, Medicaid or managed care program needed by the patient, have an available bed and are willing to accept the patient.  Yes   Patient/family informed of [place]'s ownership interest in [facility] and [facility], as well as of the fact that they are under no obligation to receive care at these facilities.  PASRR submitted to EDS on       PASRR number received on       Existing PASRR number confirmed on 08/25/17     FL2 transmitted to all facilities in geographic area requested by pt/family on 08/25/17     FL2 transmitted to all facilities within larger geographic area on       Patient informed that his/her managed care company has contracts with or will negotiate with certain facilities, including the following:        Yes   Patient/family informed of bed offers received.  Patient chooses bed at [facility] )     Physician recommends and patient chooses bed at      Patient to be transferred to   on  .  Patient to be transferred to facility by       Patient family notified on   of transfer.  Name of family member notified:        PHYSICIAN       Additional Comment:    _______________________________________________ Anastacio Bua, Veronia Beets, LCSW 08/25/2017, 11:36  AM

## 2017-08-25 NOTE — Progress Notes (Addendum)
[facility] [place] NAME: Katelyn Gregory    MR#:  [id number]  DATE OF BIRTH:  [birth year]  SUBJECTIVE:  CHIEF COMPLAINT:   Chief Complaint  Patient presents with  . Placement   -Patient admitted with weakness and falls and noted to have left-sided pneumonia -Recent right humeral fracture, in a sling.  Blood pressure is low normal  REVIEW OF SYSTEMS:  Review of Systems  Constitutional: Negative for chills and fever.  HENT: Positive for hearing loss.   Eyes: Negative for blurred vision and double vision.  Respiratory: Positive for cough. Negative for shortness of breath and wheezing.   Cardiovascular: Negative for chest pain and palpitations.  Gastrointestinal: Negative for abdominal pain, constipation, diarrhea, nausea and vomiting.  Genitourinary: Negative for dysuria.  Musculoskeletal: Positive for falls and joint pain.  Neurological: Negative for dizziness, focal weakness, seizures, weakness and headaches.  Psychiatric/Behavioral: Negative for depression.    DRUG ALLERGIES:   Allergies  Allergen Reactions  . Penicillins Other (See Comments)    Has patient had a PCN reaction causing immediate rash, facial/tongue/throat swelling, SOB or lightheadedness with hypotension: Unknown Has patient had a PCN reaction causing severe rash involving mucus membranes or skin necrosis: Unknown Has patient had a PCN reaction that required hospitalization: Unknown Has patient had a PCN reaction occurring within the last 10 years: Unknown If all of the above answers are "NO", then may proceed with Cephalosporin use.     VITALS:  Blood pressure (!) 95/52, pulse 74, temperature 97.8 F (36.6 C), resp. rate 15, height 5\' 1"  (1.549 m), weight 56.7 kg (125 lb), SpO2 97 %.  PHYSICAL EXAMINATION:  Physical Exam  GENERAL:  84 y.o.-year-old elderly patient lying in the bed with no acute distress.  EYES: Pupils equal, round, reactive to light and  accommodation. No scleral icterus. Extraocular muscles intact.  HEENT: Head atraumatic, normocephalic. Oropharynx and nasopharynx clear.  NECK:  Supple, no jugular venous distention. No thyroid enlargement, no tenderness.  LUNGS: Normal breath sounds bilaterally, no wheezing, rales,rhonchi or crepitation. No use of accessory muscles of respiration. Decreased bibasilar breath sounds CARDIOVASCULAR: S1, S2 normal. No  rubs, or gallops. 2/6 systolic murmur present ABDOMEN: Soft, nontender, nondistended. Bowel sounds present. No organomegaly or mass.  EXTREMITIES: right arm in a sling. No pedal edema, cyanosis, or clubbing.  NEUROLOGIC: Cranial nerves II through XII are intact. Muscle strength 5/5 in all extremities. Sensation intact. Gait not checked. Global weakness noted. PSYCHIATRIC: The patient is alert and oriented x 3.  SKIN: No obvious rash, lesion, or ulcer.    LABORATORY PANEL:   CBC Recent Labs  Lab 08/25/17 0437  WBC 9.0  HGB 8.3*  HCT 24.5*  PLT 152   ------------------------------------------------------------------------------------------------------------------  Chemistries  Recent Labs  Lab 08/25/17 0437  NA 145  K 3.9  CL 111  CO2 26  GLUCOSE 188*  BUN 79*  CREATININE 2.88*  CALCIUM 8.1*   ------------------------------------------------------------------------------------------------------------------  Cardiac Enzymes Recent Labs  Lab 08/24/17 1709  TROPONINI <0.03   ------------------------------------------------------------------------------------------------------------------  RADIOLOGY:  Dg Chest 1 View  Result Date: 08/24/2017 CLINICAL DATA:  Weakness, recent fall and humeral fracture. Shortness of breath with exertion. EXAM: CHEST  1 VIEW COMPARISON:  02/23/2013. FINDINGS: Trachea is midline. Heart is enlarged. Thoracic aorta is calcified. Large hiatal hernia. Opacification at the base of the left hemithorax may represent a combination of  pleural fluid and airspace consolidation. Right lung is clear. Partially imaged right humeral neck fracture.  Old rib fractures. Osteopenia. IMPRESSION: 1. Opacification at the base of the left hemithorax may be due to a combination of pleural fluid and pneumonia. Followup PA and lateral chest X-ray is recommended in 3-4 weeks following trial of antibiotic therapy to ensure resolution and exclude underlying malignancy. 2.  Aortic atherosclerosis (ICD10-170.0). 3. Large hiatal hernia. Electronically Signed   By: Lorin Picket M.D.   On: 08/24/2017 17:36    EKG:   Orders placed or performed during the hospital encounter of 08/24/17  . ED EKG  . ED EKG    ASSESSMENT AND PLAN:   84 year old female with past medical history significant for hypertension, non-insulin-dependent diabetes mellitus and osteoporosis who is independent at baseline presents to hospital secondary to a fall and weakness  1.  Fall and weakness-fall and ER visit showing right humeral fracture, was discharged home-but unable to care for herself and is brought back. -Pain control, physical therapy consult -Orthopedics has been consulted -Check urine analysis to rule out infection  2.  Community-acquired pneumonia-follow blood cultures -Patient is on Levaquin as she is allergic to penicillins.  Unknown reaction and patient is not aware -We will change that to doxycycline given tendon issues with levaquin  3.  Diabetes mellitus-on glipizide and sliding scale insulin  4.  Anemia-drop in hemoglobin by 2 points, likely dilutional.  Check anemia panel.  No active bleeding at this time.  Unsure if it this is acute or chronic.  No outpatient labs available to compare at this time.  5.  GERD-Protonix  6.  DVT prophylaxis-Lovenox  Physical therapy consulted     All the records are reviewed and case discussed with Care Management/Social Workerr. Management plans discussed with the patient, family and they are in  agreement.  CODE STATUS: Full Code  TOTAL TIME TAKING CARE OF THIS PATIENT: 38 minutes.   POSSIBLE D/C IN 1-2 DAYS, DEPENDING ON CLINICAL CONDITION.   Gladstone Lighter M.D on 08/25/2017 at 8:57 AM  Between 7am to 6pm - Pager - [phone]  After 6pm go to [email or web address] - password [email or web address] [facility]  Office  [phone]  CC: Primary care physician; Center, [facility]

## 2017-08-25 NOTE — Progress Notes (Signed)
Lovenox changed to heparin SQ 5000 units q 12 hours for CrCl <15 and BMI <40 per protocol.

## 2017-08-25 NOTE — Consult Note (Signed)
ORTHOPAEDIC CONSULTATION  REQUESTING PHYSICIAN: Gladstone Lighter, MD  Chief Complaint:   R shoulder pain  History of Present Illness: Katelyn Gregory is a 84 y.o. femalewho had a fall at home 2 days ago. She went to stand up, tripped, and had a fall.  She noted immediate right shoulder pain.  She was seen in the ED that day. X-rays showed a R proximal humerus fracture with significant comminution. She was placed in a sling and discharged home.  She normally lives with her son, but he is sick with cancer and was unable to care for her.  She represented to the emergency room the next day to be admitted for placement.   The patient ambulates with a walker at baseline.  She cannot use the walker currently due to her right shoulder pain.  Pain is described as sharp at its worst and a dull ache at its best.  Pain is rated an 8/10 in severity.  Pain is improved with rest and immobilization.  Pain is worse with any sort of movement.   Of note, she was discovered to have pneumonia as well as acute renal failure in the setting of chronic kidney disease.  She also has a history of diabetes and osteoporosis.  Past Medical History:  Diagnosis Date  . Cancer ([place])    skin  . Diabetes mellitus without complication ([place])   . Hypertension   . Osteoporosis   . Renal disorder    Past Surgical History:  Procedure Laterality Date  . TOTAL HIP ARTHROPLASTY     Social History   Socioeconomic History  . Marital status: Widowed    Spouse name: Not on file  . Number of children: Not on file  . Years of education: Not on file  . Highest education level: Not on file  Occupational History    Employer: RETIRED  Social Needs  . Financial resource strain: Not on file  . Food insecurity:    Worry: Not on file    Inability: Not on file  . Transportation needs:    Medical: Not on file    Non-medical: Not on file  Tobacco Use  . Smoking status:  Never Smoker  . Smokeless tobacco: Never Used  Substance and Sexual Activity  . Alcohol use: Never    Frequency: Never  . Drug use: Never  . Sexual activity: Not on file  Lifestyle  . Physical activity:    Days per week: Not on file    Minutes per session: Not on file  . Stress: Not on file  Relationships  . Social connections:    Talks on phone: Not on file    Gets together: Not on file    Attends religious service: Not on file    Active member of club or organization: Not on file    Attends meetings of clubs or organizations: Not on file    Relationship status: Not on file  Other Topics Concern  . Not on file  Social History Narrative  . Not on file   No family history on file. Allergies  Allergen Reactions  . Penicillins Other (See Comments)    Has patient had a PCN reaction causing immediate rash, facial/tongue/throat swelling, SOB or lightheadedness with hypotension: Unknown Has patient had a PCN reaction causing severe rash involving mucus membranes or skin necrosis: Unknown Has patient had a PCN reaction that required hospitalization: Unknown Has patient had a PCN reaction occurring within the last 10 years: Unknown If all of  the above answers are "NO", then may proceed with Cephalosporin use.    Prior to Admission medications   Medication Sig Start Date End Date Taking? Authorizing Provider  acetaminophen (TYLENOL) 500 MG tablet Take 500-1,000 mg by mouth every 6 (six) hours as needed for mild pain.   Yes [provider]  amLODipine-benazepril (LOTREL) 5-20 MG capsule Take 1 capsule by mouth daily. 06/28/17  Yes [provider]  aspirin EC 81 MG tablet Take 81 mg by mouth daily.   Yes [provider]  calcium-vitamin D (OSCAL WITH D) 500-200 MG-UNIT tablet Take 1 tablet by mouth daily.   Yes [provider]  cetirizine (ZYRTEC) 10 MG tablet Take 10 mg by mouth daily. 06/24/17  Yes [provider]  esomeprazole (NEXIUM) 40  MG capsule Take 40 mg by mouth daily. 07/18/17  Yes [provider]  furosemide (LASIX) 40 MG tablet Take 40-80 mg by mouth See admin instructions. Alternate 40 mg and 80 mg daily. Take 40 mg by mouth every other day. Take 80 mg by mouth every other day. 08/12/17  Yes [provider]  glipiZIDE (GLUCOTROL XL) 5 MG 24 hr tablet Take 5-10 mg by mouth 2 (two) times daily. Take 5 mg by mouth in the morning. Take 10 mg by mouth in the evening. 08/18/17  Yes [provider]  Glucosamine-Chondroitin-MSM 500-200-150 MG TABS Take 1 tablet by mouth daily.   Yes [provider]  Multiple Vitamin (MULTIVITAMIN) tablet Take 1 tablet by mouth daily.   Yes [provider]  PARoxetine (PAXIL) 20 MG tablet Take 20 mg by mouth daily. 05/31/17  Yes [provider]  simvastatin (ZOCOR) 20 MG tablet Take 20 mg by mouth every evening. for cholesterol 06/28/17  Yes [provider]   Recent Labs    08/24/17 1340 08/25/17 0437  WBC 19.1* 9.0  HGB 10.6* 8.3*  HCT 31.5* 24.5*  PLT 223 152  K 4.1 3.9  CL 106 111  CO2 25 26  BUN 78* 79*  CREATININE 3.18* 2.88*  GLUCOSE 215* 188*  CALCIUM 8.7* 8.1*   Dg Chest 1 View  Result Date: 08/24/2017 CLINICAL DATA:  Weakness, recent fall and humeral fracture. Shortness of breath with exertion. EXAM: CHEST  1 VIEW COMPARISON:  02/23/2013. FINDINGS: Trachea is midline. Heart is enlarged. Thoracic aorta is calcified. Large hiatal hernia. Opacification at the base of the left hemithorax may represent a combination of pleural fluid and airspace consolidation. Right lung is clear. Partially imaged right humeral neck fracture. Old rib fractures. Osteopenia. IMPRESSION: 1. Opacification at the base of the left hemithorax may be due to a combination of pleural fluid and pneumonia. Followup PA and lateral chest X-ray is recommended in 3-4 weeks following trial of antibiotic therapy to ensure resolution and exclude underlying  malignancy. 2.  Aortic atherosclerosis (ICD10-170.0). 3. Large hiatal hernia. Electronically Signed   By: Lorin Picket M.D.   On: 08/24/2017 17:36   Dg Shoulder Right  Result Date: 08/25/2017 CLINICAL DATA:  In-patient RIGHT humeral fracture. EXAM: RIGHT SHOULDER - 2+ VIEW COMPARISON:  Plain film of the RIGHT humerus dated 08/22/2017. FINDINGS: Again noted is a markedly displaced/comminuted fracture of the RIGHT humeral neck and or proximal humeral shaft, not significantly changed in alignment compared to the earlier exam. Humeral head remains grossly well positioned relative to the glenoid fossa. Overlying acromioclavicular joint space is normally aligned. IMPRESSION: No significant change of the displaced/comminuted fracture within the proximal RIGHT humerus. Electronically Signed  By: Franki Cabot M.D.   On: 08/25/2017 10:41     Positive ROS: All other systems have been reviewed and were otherwise negative with the exception of those mentioned in the HPI and as above.  Physical Exam: BP (!) 95/52 (BP Location: Left Arm)   Pulse 74   Temp 97.8 F (36.6 C)   Resp 15   Ht 5\' 1"  (1.549 m)   Wt 56.7 kg (125 lb)   SpO2 97%   BMI 23.62 kg/m  General:  Alert, no acute distress Psychiatric:  Patient is competent for consent with normal mood and affect   Cardiovascular:  No pedal edema, regular rate and rhythm Respiratory:  No wheezing, non-labored breathing GI:  Abdomen is soft and non-tender Skin:  No lesions in the area of chief complaint, no erythema Neurologic:  Sensation intact distally, CN grossly intact Lymphatic:  No axillary or cervical lymphadenopathy  Orthopedic Exam:  RUE: +ain/pin/u/ax motor SILT r/u/m/ax +rad pulse Arm in sling Bruising about hand and forearm    X-rays:  As above: Comminuted fracture of the proximal humerus with primary fracture line inferior to the humeral head.  The junction of the humeral shaft and humeral head is significantly comminuted.   The humeral head appears to be in one main fragment without tuberosity fracture.  There does not appear to be a dislocation.  There are mild degenerative change of the acromioclavicular joint.  Assessment/Plan: Katelyn Gregory is a 84 y.o. female with a R comminuted proximal humerus fracture at the level of the humeral head and humeral shaft junction.   1. I discussed the various treatment options including both surgical and non-surgical management of her fracture with the patient.  Surgical options would include reverse shoulder arthroplasty versus ORIF of proximal humerus.  We discussed the high risk of perioperative/postoperative complications due to patient's age and comorbidities including chronic kidney disease, pneumonia, diabetes, and severe osteoporosis.  Nonoperative management would consist of sling immobilization for 4 to 6 weeks with progression of passive range of motion afterwards.  We discussed that her weightbearing limitations would likely be similar both with and without surgery.  We also discussed that nonoperative management would likely limit her function of her shoulder given the current bony alignment. After discussion of risks, the patient elected to proceed with nonsurgical management of this fracture.   2.  Immobilization with sling. 3.  Nonweightbearing on right upper extremity. 4.  Patient can follow-up with me as an outpatient in approximately 2 weeks 5.  Please page with any questions or concerns      Leim Fabry   08/25/2017 4:07 PM

## 2017-08-25 NOTE — NC FL2 (Signed)
[place] LEVEL OF CARE SCREENING TOOL     IDENTIFICATION  Patient Name: Katelyn Gregory Birthdate: 1926/05/25 Sex: female Admission Date (Current Location): 08/24/2017  [place] and [place] Number:  Engineering geologist and Address:  [facility], [address] [place], [place] [address]      Provider Number: [id number]  Attending Physician Name and Address:  Gladstone Lighter, MD  Relative Name and Phone Number:       Current Level of Care: Hospital Recommended Level of Care: [place] Prior Approval Number:    Date Approved/Denied:   PASRR Number: [id number])  Discharge Plan: SNF    Current Diagnoses: Patient Active Problem List   Diagnosis Date Noted  . Pneumonia 08/24/2017    Orientation RESPIRATION BLADDER Height & Weight     Self, Time, Place  Normal Continent Weight: 125 lb (56.7 kg) Height:  5\' 1"  (154.9 cm)  BEHAVIORAL SYMPTOMS/MOOD NEUROLOGICAL BOWEL NUTRITION STATUS      Continent Diet(Diet: Heart Healthy/ Carb Modified. )  AMBULATORY STATUS COMMUNICATION OF NEEDS Skin   Extensive Assist Verbally Normal                       Personal Care Assistance Level of Assistance  Bathing, Feeding, Dressing Bathing Assistance: Limited assistance Feeding assistance: Independent Dressing Assistance: Limited assistance     Functional Limitations Info  Sight, Hearing, Speech Sight Info: Adequate Hearing Info: Adequate Speech Info: Adequate    SPECIAL CARE FACTORS FREQUENCY  PT (By licensed PT), OT (By licensed OT)     PT Frequency: (5) OT Frequency: (5)            Contractures      Additional Factors Info  Code Status, Allergies Code Status Info: (Full Code. ) Allergies Info: (Penicillins)           Current Medications (08/25/2017):  This is the current hospital active medication list Current Facility-Administered Medications  Medication Dose Route Frequency Provider Last Rate  Last Dose  . 0.9 %  sodium chloride infusion   Intravenous Continuous Saundra Shelling, MD 75 mL/hr at 08/24/17 2037    . acetaminophen (TYLENOL) tablet 650 mg  650 mg Oral Q6H PRN Saundra Shelling, MD       Or  . acetaminophen (TYLENOL) suppository 650 mg  650 mg Rectal Q6H PRN Pyreddy, Reatha Harps, MD      . aspirin EC tablet 81 mg  81 mg Oral Daily Pyreddy, Reatha Harps, MD   81 mg at 08/25/17 0837  . calcium-vitamin D (OSCAL WITH D) 500-200 MG-UNIT per tablet 1 tablet  1 tablet Oral Daily Saundra Shelling, MD   1 tablet at 08/25/17 0838  . doxycycline (VIBRA-TABS) tablet 100 mg  100 mg Oral Q12H Gladstone Lighter, MD      . enoxaparin (LOVENOX) injection 40 mg  40 mg Subcutaneous Q24H Gladstone Lighter, MD      . glipiZIDE (GLUCOTROL XL) 24 hr tablet 5 mg  5 mg Oral BID Saundra Shelling, MD   5 mg at 08/25/17 0837  . HYDROcodone-acetaminophen (NORCO/VICODIN) 5-325 MG per tablet 1-2 tablet  1-2 tablet Oral Q4H PRN Saundra Shelling, MD   1 tablet at 08/24/17 2148  . insulin aspart (novoLOG) injection 0-15 Units  0-15 Units Subcutaneous TID WC Pyreddy, Pavan, MD      . insulin aspart (novoLOG) injection 0-5 Units  0-5 Units Subcutaneous QHS Pyreddy, Pavan, MD      . loratadine (CLARITIN) tablet  10 mg  10 mg Oral Daily Saundra Shelling, MD   10 mg at 08/25/17 9476  . multivitamin with minerals tablet 1 tablet  1 tablet Oral Daily Saundra Shelling, MD   1 tablet at 08/25/17 0838  . ondansetron (ZOFRAN) tablet 4 mg  4 mg Oral Q6H PRN Pyreddy, Reatha Harps, MD       Or  . ondansetron (ZOFRAN) injection 4 mg  4 mg Intravenous Q6H PRN Pyreddy, Pavan, MD      . pantoprazole (PROTONIX) EC tablet 40 mg  40 mg Oral Daily Pyreddy, Reatha Harps, MD   40 mg at 08/25/17 0837  . PARoxetine (PAXIL) tablet 20 mg  20 mg Oral Daily Pyreddy, Reatha Harps, MD   20 mg at 08/25/17 0838  . senna-docusate (Senokot-S) tablet 1 tablet  1 tablet Oral QHS PRN Saundra Shelling, MD      . simvastatin (ZOCOR) tablet 20 mg  20 mg Oral QPM Pyreddy, Reatha Harps, MD   20 mg at  08/24/17 2152  . sodium chloride 0.9 % bolus 250 mL  250 mL Intravenous Once Gladstone Lighter, MD         Discharge Medications: Please see discharge summary for a list of discharge medications.  Relevant Imaging Results:  Relevant Lab Results:   Additional Information (SSN: [id number])  [place], Veronia Beets, LCSW

## 2017-08-25 NOTE — Clinical Social Work Note (Signed)
Clinical Social Work Assessment  Patient Details  Name: Katelyn Gregory MRN: [id number] Date of Birth: [birth year]  Date of referral:  08/25/17               Reason for consult:  Discharge Planning, Facility Placement                Permission sought to share information with:  Chartered certified accountant granted to share information::  Yes, Verbal Permission Granted  Name::      [place]::   [place]   Relationship::     Contact Information:     Housing/Transportation Living arrangements for the past 2 months:  [place] of Information:  Patient, Adult Children Patient Interpreter Needed:  None Criminal Activity/Legal Involvement Pertinent to Current Situation/Hospitalization:  No - Comment as needed Significant Relationships:  Adult Children Lives with:  Adult Children Do you feel safe going back to the place where you live?  Yes Need for family participation in patient care:  Yes (Comment)  Care giving concerns:  Patient lives with her son Katelyn Gregory in [place].    Social Worker assessment / plan:  Holiday representative (CSW) received SNF consult. PT is pending. CSW met with patient and her daughter Katelyn Gregory, son in law and private duty caregiver were at bedside. Patient was laying in the bed and was alert and oriented X3. CSW introduced self and explained role of CSW department. Per patient she lives with her son Katelyn Gregory in [place]. Patient's daughter Katelyn Gregory reported that Katelyn Gregory has cancer and is having a hard time taking care of patient right now. Katelyn Gregory and patient are requesting SNF placement. CSW explained that PT will have to evaluate patient and will make a recommendation of home health or SNF. CSW also explained that medicare requires a 3 night qualifying inpatient stay in a hospital in order to pay for SNF. Patient was admitted to inpatient on 08/24/17. Patient verbalized her understanding and is agreeable to SNF  search in [place]. FL2 complete and faxed out.   CSW presented bed offers. Patient chose [facility] and is okay with a semi-private room. [facility] admissions coordinator at [facility] is aware of above. CSW will continue to follow and assist as needed.   Employment status:  Retired Forensic scientist:  Medicare PT Recommendations:  Not assessed at this time [place] / Referral to community resources:  [place]  Patient/Family's Response to care:  Patient chose [place].   Patient/Family's Understanding of and Emotional Response to Diagnosis, Current Treatment, and Prognosis:  Patient and her daughter were very pleasant and thanked CSW for assistance.   Emotional Assessment Appearance:  Appears stated age Attitude/Demeanor/Rapport:    Affect (typically observed):  Accepting, Adaptable, Pleasant Orientation:  Oriented to Self, Oriented to Place, Oriented to  Time, Fluctuating Orientation (Suspected and/or reported [place]) Alcohol / Substance use:  Not Applicable Psych involvement (Current and /or in the community):  No (Comment)  Discharge Needs  Concerns to be addressed:  Discharge Planning Concerns Readmission within the last 30 days:  No Current discharge risk:  Dependent with Mobility Barriers to Discharge:  Continued Medical Work up   [employer], Katelyn Beets, LCSW 08/25/2017, 11:38 AM

## 2017-08-26 ENCOUNTER — Inpatient Hospital Stay: Payer: Medicare Other

## 2017-08-26 LAB — BASIC METABOLIC PANEL
ANION GAP: 7 (ref 5–15)
BUN: 68 mg/dL — ABNORMAL HIGH (ref 8–23)
CO2: 23 mmol/L (ref 22–32)
Calcium: 8 mg/dL — ABNORMAL LOW (ref 8.9–10.3)
Chloride: 116 mmol/L — ABNORMAL HIGH (ref 98–111)
Creatinine, Ser: 2.21 mg/dL — ABNORMAL HIGH (ref 0.44–1.00)
GFR calc non Af Amer: 19 mL/min — ABNORMAL LOW (ref >60)
GFR, EST AFRICAN AMERICAN: 22 mL/min — AB (ref >60)
GLUCOSE: 125 mg/dL — AB (ref 70–99)
POTASSIUM: 3.7 mmol/L (ref 3.5–5.1)
Sodium: 146 mmol/L — ABNORMAL HIGH (ref 135–145)

## 2017-08-26 LAB — CBC
HEMATOCRIT: 23.6 % — AB (ref 35.0–47.0)
HEMOGLOBIN: 7.9 g/dL — AB (ref 12.0–16.0)
MCH: 32.7 pg (ref 26.0–34.0)
MCHC: 33.5 g/dL (ref 32.0–36.0)
MCV: 97.7 fL (ref 80.0–100.0)
Platelets: 156 10*3/uL (ref 150–440)
RBC: 2.42 MIL/uL — AB (ref 3.80–5.20)
RDW: 13.1 % (ref 11.5–14.5)
WBC: 9.6 10*3/uL (ref 3.6–11.0)

## 2017-08-26 LAB — GLUCOSE, CAPILLARY
GLUCOSE-CAPILLARY: 127 mg/dL — AB (ref 70–99)
Glucose-Capillary: 71 mg/dL (ref 70–99)
Glucose-Capillary: 155 mg/dL — ABNORMAL HIGH (ref 70–99)
Glucose-Capillary: 211 mg/dL — ABNORMAL HIGH (ref 70–99)

## 2017-08-26 MED ORDER — SODIUM CHLORIDE 0.9 % IV SOLN
1.0000 g | INTRAVENOUS | Status: DC
  Administered 2017-08-26: 1 g via INTRAVENOUS
  Filled 2017-08-26: qty 1
  Filled 2017-08-26: qty 10

## 2017-08-26 MED ORDER — SODIUM CHLORIDE 0.9 % IV SOLN
200.0000 mg | INTRAVENOUS | Status: DC
  Administered 2017-08-26: 200 mg via INTRAVENOUS
  Filled 2017-08-26 (×2): qty 10

## 2017-08-26 MED ORDER — BISACODYL 10 MG RE SUPP
10.0000 mg | Freq: Every day | RECTAL | Status: DC | PRN
  Administered 2017-08-26: 10 mg via RECTAL
  Filled 2017-08-26: qty 1

## 2017-08-26 NOTE — Progress Notes (Signed)
PT went into patient's room and found patient ar 82% on RA. Placed 2L O2 on patient.  This RN went into room and encouraged patient to take deep breaths. Re-checked patient and found to be at 94-95% on the 2L. Strongly encouraged patient to use IS and cough and deep breath. Discussed with patient and family that the longer she stays in bed and refuses to get up the longer it will take for her to get better. Only gave patient 2 (50mg ) Tramadol all day for pain relief.

## 2017-08-26 NOTE — Progress Notes (Signed)
[facility] NAME: Katelyn Gregory    MR#:  [id number]  DATE OF BIRTH:  1926/10/06  SUBJECTIVE:  CHIEF COMPLAINT:   Chief Complaint  Patient presents with  . Placement   - complains of right shoulder pain - slowly improving  REVIEW OF SYSTEMS:  Review of Systems  Constitutional: Negative for chills and fever.  HENT: Positive for hearing loss.   Eyes: Negative for blurred vision and double vision.  Respiratory: Positive for cough. Negative for shortness of breath and wheezing.   Cardiovascular: Negative for chest pain and palpitations.  Gastrointestinal: Negative for abdominal pain, constipation, diarrhea, nausea and vomiting.  Genitourinary: Negative for dysuria.  Musculoskeletal: Positive for falls and joint pain.  Neurological: Negative for dizziness, focal weakness, seizures, weakness and headaches.  Psychiatric/Behavioral: Negative for depression.    DRUG ALLERGIES:   Allergies  Allergen Reactions  . Penicillins Other (See Comments)    Has patient had a PCN reaction causing immediate rash, facial/tongue/throat swelling, SOB or lightheadedness with hypotension: Unknown Has patient had a PCN reaction causing severe rash involving mucus membranes or skin necrosis: Unknown Has patient had a PCN reaction that required hospitalization: Unknown Has patient had a PCN reaction occurring within the last 10 years: Unknown If all of the above answers are "NO", then may proceed with Cephalosporin use.     VITALS:  Blood pressure (!) 110/58, pulse 92, temperature 97.6 F (36.4 C), temperature source Oral, resp. rate 16, height 5\' 1"  (1.549 m), weight 56.7 kg (125 lb), SpO2 96 %.  PHYSICAL EXAMINATION:  Physical Exam  GENERAL:  84 y.o.-year-old elderly patient lying in the bed with no acute distress.  EYES: Pupils equal, round, reactive to light and accommodation. No scleral icterus. Extraocular muscles intact.  HEENT: Head  atraumatic, normocephalic. Oropharynx and nasopharynx clear.  NECK:  Supple, no jugular venous distention. No thyroid enlargement, no tenderness.  LUNGS: Normal breath sounds bilaterally, no wheezing, rales,rhonchi or crepitation. No use of accessory muscles of respiration. Decreased bibasilar breath sounds CARDIOVASCULAR: S1, S2 normal. No  rubs, or gallops. 2/6 systolic murmur present ABDOMEN: Soft, nontender, nondistended. Bowel sounds present. No organomegaly or mass.  EXTREMITIES: right arm in a sling. No pedal edema, cyanosis, or clubbing.  NEUROLOGIC: Cranial nerves II through XII are intact. Muscle strength 5/5 in all extremities. Sensation intact. Gait not checked. Global weakness noted. PSYCHIATRIC: The patient is alert and oriented x 3.  SKIN: No obvious rash, lesion, or ulcer.    LABORATORY PANEL:   CBC Recent Labs  Lab 08/26/17 0341  WBC 9.6  HGB 7.9*  HCT 23.6*  PLT 156   ------------------------------------------------------------------------------------------------------------------  Chemistries  Recent Labs  Lab 08/26/17 0341  NA 146*  K 3.7  CL 116*  CO2 23  GLUCOSE 125*  BUN 68*  CREATININE 2.21*  CALCIUM 8.0*   ------------------------------------------------------------------------------------------------------------------  Cardiac Enzymes Recent Labs  Lab 08/24/17 1709  TROPONINI <0.03   ------------------------------------------------------------------------------------------------------------------  RADIOLOGY:  Dg Chest 1 View  Result Date: 08/24/2017 CLINICAL DATA:  Weakness, recent fall and humeral fracture. Shortness of breath with exertion. EXAM: CHEST  1 VIEW COMPARISON:  02/23/2013. FINDINGS: Trachea is midline. Heart is enlarged. Thoracic aorta is calcified. Large hiatal hernia. Opacification at the base of the left hemithorax may represent a combination of pleural fluid and airspace consolidation. Right lung is clear. Partially imaged  right humeral neck fracture. Old rib fractures. Osteopenia. IMPRESSION: 1. Opacification at the base of the left  hemithorax may be due to a combination of pleural fluid and pneumonia. Followup PA and lateral chest X-ray is recommended in 3-4 weeks following trial of antibiotic therapy to ensure resolution and exclude underlying malignancy. 2.  Aortic atherosclerosis (ICD10-170.0). 3. Large hiatal hernia. Electronically Signed   By: Lorin Picket M.D.   On: 08/24/2017 17:36   Dg Shoulder Right  Result Date: 08/25/2017 CLINICAL DATA:  In-patient RIGHT humeral fracture. EXAM: RIGHT SHOULDER - 2+ VIEW COMPARISON:  Plain film of the RIGHT humerus dated 08/22/2017. FINDINGS: Again noted is a markedly displaced/comminuted fracture of the RIGHT humeral neck and or proximal humeral shaft, not significantly changed in alignment compared to the earlier exam. Humeral head remains grossly well positioned relative to the glenoid fossa. Overlying acromioclavicular joint space is normally aligned. IMPRESSION: No significant change of the displaced/comminuted fracture within the proximal RIGHT humerus. Electronically Signed   By: Franki Cabot M.D.   On: 08/25/2017 10:41   US Renal  Result Date: 08/26/2017 CLINICAL DATA:  Acute renal failure EXAM: RENAL / URINARY TRACT ULTRASOUND COMPLETE COMPARISON:  None. FINDINGS: Right Kidney: Length: 9.5 cm. Diffusely thin cortex. Echogenicity of the cortex is within normal limits without mass or cyst. No hydronephrosis. Left Kidney: Length: 9.1 cm. Diffusely thin cortex. Echogenicity of the cortex is within normal limits without mass or cyst. No hydronephrosis. Bladder: Appears normal for degree of bladder distention. There is an additional simple appearing cystic structure within the pelvis, immediately adjacent to the bladder, measuring approximately 4.4 cm greatest dimension, of uncertain etiology. IMPRESSION: 1. Both kidneys with diffusely thin cortices suggesting chronic medical  renal disease and/or chronic renovascular disease. 2. No acute findings.  No hydronephrosis. 3. Simple appearing cystic structure within the lower pelvis, adjacent to the bladder, measuring approximately 4.4 cm, of uncertain etiology but favored to be benign. Consider nonemergent CT of the pelvis for further characterization. Electronically Signed   By: Franki Cabot M.D.   On: 08/26/2017 10:00    EKG:   Orders placed or performed during the hospital encounter of 08/24/17  . ED EKG  . ED EKG    ASSESSMENT AND PLAN:   84 year old female with past medical history significant for hypertension, non-insulin-dependent diabetes mellitus and osteoporosis who is independent at baseline presents to hospital secondary to a fall and weakness  1.  Fall and weakness-fall and ER visit showing right humeral fracture, was discharged home-but unable to care for herself and is brought back. -Pain control, physical therapy consult- recommended rehab -Orthopedics consult appreciated- patient decided for non surgical option. Outpatient f/u with ortho -UA positive for infection- f/u cultures - on  rocephin  2.  Community-acquired pneumonia-follow blood cultures -started rocephin- family confirms no severe allergy to PCN.  3.  Diabetes mellitus-on glipizide and sliding scale insulin  4.  Anemia-drop in hemoglobin by 2 points, likely dilutional.   - has iron deficiency - also has anemia of chronic disease from CKD - venofer for 2 days IV and then oral supplements  5.  ARF- has underlying CKD, cr almost 10 years ago at 1.4 -renal US with significant medicorenal disease - IV fluids and improving creatinine  6.  DVT prophylaxis-Lovenox  Physical therapy consulted Likely SNF tomorrow Family updated at bedside     All the records are reviewed and case discussed with Care Management/Social Workerr. Management plans discussed with the patient, family and they are in agreement.  CODE STATUS: Full  Code  TOTAL TIME TAKING CARE OF THIS PATIENT: 32  minutes.   POSSIBLE D/C IN 1-2 DAYS, DEPENDING ON CLINICAL CONDITION.   Gladstone Lighter M.D on 08/26/2017 at 12:32 PM  Between 7am to 6pm - Pager - [phone]  After 6pm go to [email or web address] - password [email or web address] [facility]  Office  [phone]  CC: Primary care physician; Center, [facility]

## 2017-08-26 NOTE — Progress Notes (Signed)
Plan is for patient to D/C to [facility] tomorrow to room 215 pending medical clearance. Patient is aware of above.   [employer], LCSW [phone]

## 2017-08-26 NOTE — Progress Notes (Signed)
Physical Therapy Treatment Patient Details Name: Katelyn Gregory MRN: [id number] DOB: 1927/01/12 Today's Date: 08/26/2017    History of Present Illness Pt. is an 84 y.o. female who was admiited to [facility] with a right Proximal humerus fracture sustained during a fall. Pt. elected for nonoperative management of the fracture. Pt. also has Pneumonia. Pt. PMHx includes: skin cancer, diabetes mellitus type 2, hypertension, osteoporosis.     PT Comments    On PT arrival pt showed high pain levels and discomfort. Pt reported feeling "out of position", and therapist repositioned pt for better comfort. Pt was able to converse with therapist and respond to commands. On BLE pt performed ankle pumps and active assisted SLR and heel slides, and on BUE: hand squeeze and release. During all activities pt showed very little strength and exercise tolerance. Pt showed increased fatigue throughout session, requiring constant cueing for alertness and attention to task. O2 stats were checked and recorded at 82 % on room air. Nurse was notified of pt O2 and per RN applied 2L of O2. Pt O2 stats returned to optimal level. Further therapy was determined to be ill advised, and was discontinued.   Follow Up Recommendations  SNF     Equipment Recommendations  Wheelchair (measurements PT)    Recommendations for Other Services       Precautions / Restrictions Precautions Precautions: Fall Precaution Comments: RUE  Restrictions Weight Bearing Restrictions: Yes RUE Weight Bearing: Non weight bearing    Mobility  Bed Mobility                  Transfers                    Ambulation/Gait                 Stairs             Wheelchair Mobility    Modified Rankin (Stroke Patients Only)       Balance                                            Cognition Arousal/Alertness: Lethargic Behavior During Therapy: (Unable to stay alert for more than 10-15  sec) Overall Cognitive Status: Impaired/Different from baseline Area of Impairment: Attention                   Current Attention Level: Sustained           General Comments: Pt has difficulty staying awake/alert, but is able to hear and respond to verbal questions/commands without confusion.       Exercises Other Exercises Other Exercises: Bed exercises: AAROM BLE SLR x10, Ankle pumps x10. AROM BUE Wrist and finger flex/ext x 10    General Comments        Pertinent Vitals/Pain Pain Assessment: 0-10 Pain Score: 7  Pain Location: RUE Pain Descriptors / Indicators: Aching    Home Living                      Prior Function            PT Goals (current goals can now be found in the care plan section)      Frequency    7X/week      PT Plan Current plan remains appropriate    Co-evaluation  AM-PAC PT "6 Clicks" Daily Activity  Outcome Measure  Difficulty turning over in bed (including adjusting bedclothes, sheets and blankets)?: Unable Difficulty moving from lying on back to sitting on the side of the bed? : Unable Difficulty sitting down on and standing up from a chair with arms (e.g., wheelchair, bedside commode, etc,.)?: Unable Help needed moving to and from a bed to chair (including a wheelchair)?: Total Help needed walking in hospital room?: Total Help needed climbing 3-5 steps with a railing? : Total 6 Click Score: 6    End of Session Equipment Utilized During Treatment: Oxygen Activity Tolerance: Treatment limited secondary to medical complications (Comment);Patient limited by fatigue;Patient limited by lethargy;Patient limited by pain Patient left: in bed;with family/visitor present;with call bell/phone within reach;with SCD's reapplied Nurse Communication: Mobility status;Other (comment)(Relayed pt's O2 stats overall decrease in arousal) PT Visit Diagnosis: Muscle weakness (generalized) (M62.81);Adult, failure to  thrive (R62.7);Pain Pain - Right/Left: Right Pain - part of body: Shoulder;Arm;Hand     Time: 1545-1610 PT Time Calculation (min) (ACUTE ONLY): 25 min  Charges:                       G Codes:       Hortencia Conradi, SPT 09-18-2017,4:38 PM

## 2017-08-26 NOTE — Evaluation (Signed)
Occupational Therapy Evaluation Patient Details Name: Katelyn Gregory MRN: [id number] DOB: 03/28/1926 Today's Date: 08/26/2017    History of Present Illness Pt. is an 84 y.o. female who was admiited to [facility] with a right Proximal humerus fracture sustained during a fall. Pt. elected for nonoperative management of the fracture. Pt. also has Pneumonia. Pt. PMHx includes: skin cancer, diabetes mellitus type 2, hypertension, osteoporosis.    Clinical Impression   Pt. presents with weakness, dominant RUE immobilization, limited activity tolerance, and limited functional mobility which limits her ability to complete basic ADL and IADL functioning. Pt. Resides at home alone. Pt. Resides at home with her son.    Pt. Was able to perform daily morning care tasks independently. Pt. Has a personal care aide one day a week on Thursdays who assists with showering, and household tasks. Pt.'s son assists with meal preparation, medication management, and performing home management tasks. Pt.'s son has expressed that he is unable to physically assist, and care for the pt. At home secondary to having medical issues, and Cancer himself. Pt. was assisted with repositioning the sling for proper fit. Pt. Education was provided about one armed self care tasks, and A/E use for LE ADLs. Pt. Could benefit from OT services for ADL training, A/E training, and pt. Education about positioning, Energy conservation, and work simplification techniques home modification, and DME. Pt. would benefit from SNF level of care upon discharge. Pt. Could benefit from follow-up OT services at discharge.    Follow Up Recommendations  SNF    Equipment Recommendations       Recommendations for Other Services       Precautions / Restrictions Precautions Precautions: Fall Precaution Comments: RUE  Restrictions Weight Bearing Restrictions: Yes RUE Weight Bearing: Non weight bearing      Mobility Bed Mobility Overal bed mobility: Needs  Assistance Bed Mobility: Supine to Sit     Supine to sit: +2 for physical assistance;Max assist        Transfers Overall transfer level: Needs assistance   Transfers:  Sit to Stand: +2 physical assistance;Max assist        Lateral/Scoot Transfers: +2 physical assistance;Max assist General transfer comment: Per PT report    Balance                                           ADL either performed or assessed with clinical judgement   ADL Overall ADL's : Needs assistance/impaired Eating/Feeding: Set up;Minimal assistance   Grooming: Set up;Minimal assistance   Upper Body Bathing: Maximal assistance   Lower Body Bathing: Total assistance   Upper Body Dressing : Maximal assistance   Lower Body Dressing: Total assistance       Toileting- Clothing Manipulation and Hygiene: Bed level;Total assistance         General ADL Comments: Pt. was assisted with repositioning of the sling. Pt. education was provided about A/E use for LE ADLs, ANd one armed self-care techniques.     Vision Baseline Vision/History: Wears glasses Wears Glasses: At all times       Perception     Praxis      Pertinent Vitals/Pain Pain Assessment: 0-10 Pain Score: 5  Pain Descriptors / Indicators: Aching Pain Intervention(s): Limited activity within patient's tolerance     Hand Dominance Right   Extremity/Trunk Assessment Upper Extremity Assessment Upper Extremity Assessment: Generalized weakness RUE Deficits /  Details: R humeral fracture, immobilzed in sling, increased edema in the right hand. RUE Sensation: WNL           Communication     Cognition Arousal/Alertness: Awake/alert Behavior During Therapy: WFL for tasks assessed/performed Overall Cognitive Status: Within Functional Limits for tasks assessed                                     General Comments       Exercises     Shoulder Instructions      Home Living Family/patient  expects to be discharged to:: Private residence Living Arrangements: Children Available Help at Discharge: Family Type of Home: House       Home Layout: Multi-level     Bathroom Shower/Tub: Walk-in shower;Curtain         Home Equipment: Hand held shower head;Shower seat;Walker - 4 wheels;Wheelchair - manual          Prior Functioning/Environment      ADL's / Homemaking Assistance Needed: Pt.'s son assists with Fish farm manager. Pt. reports she tried to do what she could. Pt. has a peronal care aide 3 hours a week on Thursdays who assists pt. with showers. Pt. receives meals on wheels.              OT Problem List: Decreased strength;Impaired UE functional use;Decreased range of motion;Decreased coordination;Decreased safety awareness;Pain;Decreased activity tolerance;Impaired balance (sitting and/or standing)      OT Treatment/Interventions: Self-care/ADL training;Therapeutic exercise;Patient/family education;Energy conservation;Therapeutic activities;DME and/or AE instruction    OT Goals(Current goals can be found in the care plan section) Acute Rehab OT Goals Patient Stated Goal: To get better OT Goal Formulation: With patient Potential to Achieve Goals: Good  OT Frequency: Min 2X/week   Barriers to D/C:            Co-evaluation              AM-PAC PT "6 Clicks" Daily Activity     Outcome Measure Help from another person eating meals?: A Little Help from another person taking care of personal grooming?: A Little         6 Click Score: 6   End of Session    Activity Tolerance: Patient tolerated treatment well Patient left: in bed;with family/visitor present;with call bell/phone within reach  OT Visit Diagnosis: History of falling (Z91.81);Muscle weakness (generalized) (M62.81)                Time: 1030-1055 OT Time Calculation (min): 25 min Charges:  OT General Charges $OT Visit: 1 Visit OT Evaluation $OT Eval Moderate Complexity: 1  Mod G-Codes:     Harrel Carina, MS, OTR/L  Harrel Carina 08/26/2017, 11:20 AM

## 2017-08-26 NOTE — Care Management (Signed)
Presents with increasing weakness.  Sustained right humerus fracture after a fall 08/22/2017. Being treated for pneumonia and acute on chronic renal failure. IV fluids.  Physical therapy has recommended skilled nursing placement and bed search has been initiated

## 2017-08-27 LAB — BASIC METABOLIC PANEL
ANION GAP: 8 (ref 5–15)
BUN: 54 mg/dL — ABNORMAL HIGH (ref 8–23)
CALCIUM: 8 mg/dL — AB (ref 8.9–10.3)
CO2: 20 mmol/L — ABNORMAL LOW (ref 22–32)
Chloride: 118 mmol/L — ABNORMAL HIGH (ref 98–111)
Creatinine, Ser: 1.87 mg/dL — ABNORMAL HIGH (ref 0.44–1.00)
GFR, EST AFRICAN AMERICAN: 27 mL/min — AB (ref >60)
GFR, EST NON AFRICAN AMERICAN: 23 mL/min — AB (ref >60)
GLUCOSE: 111 mg/dL — AB (ref 70–99)
POTASSIUM: 4.2 mmol/L (ref 3.5–5.1)
SODIUM: 146 mmol/L — AB (ref 135–145)

## 2017-08-27 LAB — GLUCOSE, CAPILLARY
GLUCOSE-CAPILLARY: 59 mg/dL — AB (ref 70–99)
GLUCOSE-CAPILLARY: 60 mg/dL — AB (ref 70–99)
GLUCOSE-CAPILLARY: 89 mg/dL (ref 70–99)
Glucose-Capillary: 215 mg/dL — ABNORMAL HIGH (ref 70–99)

## 2017-08-27 MED ORDER — GLIPIZIDE 5 MG PO TABS: 2.5000 mg | ORAL_TABLET | Freq: Every day | ORAL | 1 refills | Status: DC

## 2017-08-27 MED ORDER — POLYETHYLENE GLYCOL 3350 17 G PO PACK: 17.0000 g | PACK | Freq: Every day | ORAL | Status: DC

## 2017-08-27 MED ORDER — TRAMADOL HCL 50 MG PO TABS: 50.0000 mg | ORAL_TABLET | Freq: Four times a day (QID) | ORAL | 2 refills | Status: DC | PRN

## 2017-08-27 MED ORDER — CEPHALEXIN 250 MG PO CAPS: 250.0000 mg | ORAL_CAPSULE | Freq: Three times a day (TID) | ORAL | 0 refills | Status: AC

## 2017-08-27 MED ORDER — POLYETHYLENE GLYCOL 3350 17 G PO PACK: 17.0000 g | PACK | Freq: Every day | ORAL | 0 refills | Status: AC

## 2017-08-27 MED ORDER — FUROSEMIDE 10 MG/ML IJ SOLN: 20.0000 mg | Freq: Once | INTRAMUSCULAR | Status: DC

## 2017-08-27 MED ORDER — FUROSEMIDE 20 MG PO TABS
20.0000 mg | ORAL_TABLET | Freq: Once | ORAL | Status: AC
Start: 2017-08-27 — End: 2017-08-27
  Administered 2017-08-27: 20 mg via ORAL
  Filled 2017-08-27: qty 1

## 2017-08-27 NOTE — Clinical Social Work Note (Signed)
The patient will discharge today via non-emergent EMS. The patient's granddaughter is aware and the facility is in agreement. The CSW has delivered the discharge packet to the chart and the documentation to the facility. The CSW is signing off. Please consult should needs arise.  Santiago Bumpers, MSW, Latanya Presser [phone]

## 2017-08-27 NOTE — Discharge Summary (Addendum)
Katelyn Gregory at [place] NAME: Katelyn Gregory    MR#:  [id number]  DATE OF BIRTH:  [birth year]  DATE OF ADMISSION:  08/24/2017   ADMITTING PHYSICIAN: Saundra Shelling, MD  DATE OF DISCHARGE:  08/27/17  PRIMARY CARE PHYSICIAN: [facility] [place]   ADMISSION DIAGNOSIS:   Failure to thrive in adult [R62.7] Acute renal failure, unspecified acute renal failure type ([place]) [N17.9]  DISCHARGE DIAGNOSIS:   Active Problems:   Pneumonia   SECONDARY DIAGNOSIS:   Past Medical History:  Diagnosis Date  . Cancer ([place])    skin  . Diabetes mellitus without complication ([place])   . Hypertension   . Osteoporosis   . Renal disorder     HOSPITAL COURSE:   84 year old female with past medical history significant for hypertension, non-insulin-dependent diabetes mellitus and osteoporosis who is independent at baseline presents to hospital secondary to a fall and weakness  1.  Fall and weakness-fall and ER visit showing right humeral fracture, was discharged home-but unable to care for herself and is brought back. -Pain control, physical therapy consult- recommended rehab -Orthopedics consult appreciated- patient decided for non surgical option. Outpatient f/u with ortho -UA positive for infection  - on  rocephin- change to keflex at discharge  2.  Community-acquired pneumonia- negative blood cultures -on keflex- family confirms no severe allergy to PCN.  3.  Diabetes mellitus-on glipizide-sugars are low this morning. Decreased the dose of glipizide  4.  Anemia-initial drop in hemoglobin is secondary to dilution - has iron deficiency anemia and anemia of chronic disease from CKD -IV iron received here for two days and take over-the-counter oral supplements daily.  5.  ARF- has underlying CKD, cr almost 10 years ago at 1.4 -renal US with significant medicorenal disease - IV fluids and improving creatinine at 1.8 at  discharge   Physical therapy consulted SNF today Family updated at bedside    DISCHARGE CONDITIONS:   Guarded  CONSULTS OBTAINED:   Treatment Team:  Leim Fabry, MD  DRUG ALLERGIES:   Allergies  Allergen Reactions  . Penicillins Other (See Comments)    Has patient had a PCN reaction causing immediate rash, facial/tongue/throat swelling, SOB or lightheadedness with hypotension: Unknown Has patient had a PCN reaction causing severe rash involving mucus membranes or skin necrosis: Unknown Has patient had a PCN reaction that required hospitalization: Unknown Has patient had a PCN reaction occurring within the last 10 years: Unknown If all of the above answers are "NO", then may proceed with Cephalosporin use.    DISCHARGE MEDICATIONS:   Allergies as of 08/27/2017      Reactions   Penicillins Other (See Comments)   Has patient had a PCN reaction causing immediate rash, facial/tongue/throat swelling, SOB or lightheadedness with hypotension: Unknown Has patient had a PCN reaction causing severe rash involving mucus membranes or skin necrosis: Unknown Has patient had a PCN reaction that required hospitalization: Unknown Has patient had a PCN reaction occurring within the last 10 years: Unknown If all of the above answers are "NO", then may proceed with Cephalosporin use.      Medication List    STOP taking these medications   amLODipine-benazepril 5-20 MG capsule Commonly known as:  LOTREL   aspirin EC 81 MG tablet   furosemide 40 MG tablet Commonly known as:  LASIX   glipiZIDE 5 MG 24 hr tablet Commonly known as:  GLUCOTROL XL Replaced by:  glipiZIDE 5 MG tablet  TAKE these medications   acetaminophen 500 MG tablet Commonly known as:  TYLENOL Take 500-1,000 mg by mouth every 6 (six) hours as needed for mild pain.   calcium-vitamin D 500-200 MG-UNIT tablet Commonly known as:  OSCAL WITH D Take 1 tablet by mouth daily.   cephALEXin 250 MG  capsule Commonly known as:  KEFLEX Take 1 capsule (250 mg total) by mouth 3 (three) times daily for 5 days.   cetirizine 10 MG tablet Commonly known as:  ZYRTEC Take 10 mg by mouth daily.   esomeprazole 40 MG capsule Commonly known as:  NEXIUM Take 40 mg by mouth daily.   glipiZIDE 5 MG tablet Commonly known as:  GLUCOTROL Take 0.5 tablets (2.5 mg total) by mouth daily. Start taking on:  08/28/2017 Replaces:  glipiZIDE 5 MG 24 hr tablet   Glucosamine-Chondroitin-MSM 500-200-150 MG Tabs Take 1 tablet by mouth daily.   multivitamin tablet Take 1 tablet by mouth daily.   PARoxetine 20 MG tablet Commonly known as:  PAXIL Take 20 mg by mouth daily.   polyethylene glycol packet Commonly known as:  MIRALAX / GLYCOLAX Take 17 g by mouth daily.   simvastatin 20 MG tablet Commonly known as:  ZOCOR Take 20 mg by mouth every evening. for cholesterol   traMADol 50 MG tablet Commonly known as:  ULTRAM Take 1 tablet (50 mg total) by mouth every 6 (six) hours as needed for moderate pain or severe pain.        DISCHARGE INSTRUCTIONS:   1. PCP follow-up and wants to weeks 2. Follow-up with nephrology for CKD 3. Orthopedics f/u in 2 weeks 4. Instructions for right humeral fracture: - Encouraged Incentive spirometry.  -Immobilization with sling. - Nonweightbearing on right upper extremity. Continue PT/OT for RoM exercises of finger, hand, wrist, elbow to help with swelling.    DIET:   Cardiac diet  ACTIVITY:   Activity as tolerated  OXYGEN:   Home Oxygen: Yes.    Oxygen Delivery: 2 liters/min via Patient connected to nasal cannula oxygen  DISCHARGE LOCATION:   nursing home   If you experience worsening of your admission symptoms, develop shortness of breath, life threatening emergency, suicidal or homicidal thoughts you must seek medical attention immediately by calling 911 or calling your MD immediately  if symptoms less severe.  You Must read complete  instructions/literature along with all the possible adverse reactions/side effects for all the Medicines you take and that have been prescribed to you. Take any new Medicines after you have completely understood and accpet all the possible adverse reactions/side effects.   Please note  You were cared for by a hospitalist during your hospital stay. If you have any questions about your discharge medications or the care you received while you were in the hospital after you are discharged, you can call the unit and asked to speak with the hospitalist on call if the hospitalist that took care of you is not available. Once you are discharged, your primary care physician will handle any further medical issues. Please note that NO REFILLS for any discharge medications will be authorized once you are discharged, as it is imperative that you return to your primary care physician (or establish a relationship with a primary care physician if you do not have one) for your aftercare needs so that they can reassess your need for medications and monitor your lab values.    On the day of Discharge:  VITAL SIGNS:   Blood pressure 117/66, pulse 87,  temperature 97.9 F (36.6 C), temperature source Oral, resp. rate 18, height 5\' 1"  (1.549 m), weight 56.7 kg (125 lb), SpO2 98 %.  PHYSICAL EXAMINATION:    GENERAL:  84 y.o.-year-old elderly patient lying in the bed with no acute distress.  EYES: Pupils equal, round, reactive to light and accommodation. No scleral icterus. Extraocular muscles intact.  HEENT: Head atraumatic, normocephalic. Oropharynx and nasopharynx clear.  NECK:  Supple, no jugular venous distention. No thyroid enlargement, no tenderness.  LUNGS: Normal breath sounds bilaterally, no wheezing, rales,rhonchi or crepitation. No use of accessory muscles of respiration. Decreased bibasilar breath sounds CARDIOVASCULAR: S1, S2 normal. No  rubs, or gallops. 2/6 systolic murmur present ABDOMEN: Soft,  nontender, nondistended. Bowel sounds present. No organomegaly or mass.  EXTREMITIES: right arm in a sling. No pedal edema, cyanosis, or clubbing.  NEUROLOGIC: Cranial nerves II through XII are intact. Muscle strength 5/5 in all extremities. Sensation intact. Gait not checked. Global weakness noted. PSYCHIATRIC: The patient is alert and oriented x 3.  SKIN: No obvious rash, lesion, or ulcer     DATA REVIEW:   CBC Recent Labs  Lab 08/26/17 0341  WBC 9.6  HGB 7.9*  HCT 23.6*  PLT 156    Chemistries  Recent Labs  Lab 08/27/17 0411  NA 146*  K 4.2  CL 118*  CO2 20*  GLUCOSE 111*  BUN 54*  CREATININE 1.87*  CALCIUM 8.0*     Microbiology Results  No results found for this or any previous visit.  RADIOLOGY:  No results found.   Management plans discussed with the patient, family and they are in agreement.  CODE STATUS:     Code Status Orders  (From admission, onward)        Start     Ordered   08/24/17 2009  Full code  Continuous     08/24/17 2008    Code Status History    This patient has a current code status but no historical code status.    Advance Directive Documentation     Most Recent Value  Type of Advance Directive  Living will  Pre-existing out of facility DNR order (yellow form or pink MOST form)  -  "MOST" Form in Place?  -      TOTAL TIME TAKING CARE OF THIS PATIENT: 38 minutes.    Gladstone Lighter M.D on 08/27/2017 at 10:24 AM  Between 7am to 6pm - Pager - [phone]  After 6pm go to [email or web address] - Technical brewer  Hospitalists  Office  [phone]  CC: Primary care physician; [facility]   Note: This dictation was prepared with Dragon dictation along with smaller phrase technology. Any transcriptional errors that result from this process are unintentional.

## 2017-08-27 NOTE — Progress Notes (Signed)
Report given to Rodman Key, RN at [facility]. Patent to be transported via non-emergent transport to room 215. EMS called for transport.

## 2017-08-27 NOTE — Progress Notes (Addendum)
Has had some difficulty sleeping and with mobilization due to NWB status on RUE and inability to use walker. Pain slowly improving. Bruising/swelling has spread to hand. She did have an episode with decreased O2 sats last night.   BP 117/66 (BP Location: Left Arm)   Pulse 87   Temp 97.9 F (36.6 C) (Oral)   Resp 18   Ht 5\' 1"  (1.549 m)   Wt 56.7 kg (125 lb)   SpO2 98%   BMI 23.62 kg/m    Gen: NAD, conversant Psychiatric:  Patient is competent for consent with normal mood and affect   Cardiovascular:  No pedal edema, peripheral pulses present Respiratory:  No wheezing, non-labored breathing Neurologic:  Sensation intact distally, CN grossly intact MSK:  Orthopedic Exam: RUE: +ain/pin/u/ax motor SILT r/u/m/ax +rad pulse Arm in sling Bruising about hand and forearm, increased swelling in hand    84 yo F w/R proximal humerus fracture that will be treated non-operatively 1.  Encouraged IS.  2.  Immobilization with sling. 3.  Nonweightbearing on right upper extremity. Continue PT/OT for RoM exercises of finger, hand, wrist, elbow to help with swelling. 4.  Patient can follow-up with me as an outpatient in approximately 2 weeks

## 2017-08-29 ENCOUNTER — Other Ambulatory Visit: Payer: Self-pay

## 2017-08-29 MED ORDER — TRAMADOL HCL 50 MG PO TABS: 50.0000 mg | ORAL_TABLET | Freq: Four times a day (QID) | ORAL | 0 refills | Status: DC | PRN

## 2017-08-29 NOTE — Telephone Encounter (Signed)
Rx sent to [facility] phone : [phone] , fax : [phone]  

## 2017-09-01 ENCOUNTER — Other Ambulatory Visit
Admission: RE | Admit: 2017-09-01 | Discharge: 2017-09-01 | Disposition: A | Discharge status: Home / Self Care | Payer: No Typology Code available for payment source | Source: Ambulatory Visit | Attending: Internal Medicine | Admitting: Internal Medicine

## 2017-09-01 DIAGNOSIS — J189 Pneumonia, unspecified organism: Secondary | ICD-10-CM | POA: Insufficient documentation

## 2017-09-01 LAB — CBC WITH DIFFERENTIAL/PLATELET
BASOS ABS: 0.2 10*3/uL — AB (ref 0–0.1)
Band Neutrophils: 0 %
Basophils Relative: 2 %
Blasts: 0 %
EOS PCT: 3 %
Eosinophils Absolute: 0.3 10*3/uL (ref 0–0.7)
HCT: 29.7 % — ABNORMAL LOW (ref 35.0–47.0)
HEMOGLOBIN: 9.9 g/dL — AB (ref 12.0–16.0)
LYMPHS ABS: 2.7 10*3/uL (ref 1.0–3.6)
Lymphocytes Relative: 25 %
MCH: 32.8 pg (ref 26.0–34.0)
MCHC: 33.2 g/dL (ref 32.0–36.0)
MCV: 98.8 fL (ref 80.0–100.0)
METAMYELOCYTES PCT: 1 %
MYELOCYTES: 0 %
Monocytes Absolute: 1.2 10*3/uL — ABNORMAL HIGH (ref 0.2–0.9)
Monocytes Relative: 11 %
NEUTROS PCT: 58 %
Neutro Abs: 6.3 10*3/uL (ref 1.4–6.5)
Other: 0 %
PLATELETS: 190 10*3/uL (ref 150–440)
PROMYELOCYTES RELATIVE: 0 %
RBC: 3 MIL/uL — ABNORMAL LOW (ref 3.80–5.20)
RDW: 13.2 % (ref 11.5–14.5)
WBC: 10.7 10*3/uL (ref 3.6–11.0)
nRBC: 0 /100 WBC

## 2017-09-08 ENCOUNTER — Non-Acute Institutional Stay (SKILLED_NURSING_FACILITY): Payer: Medicare Other | Admitting: Adult Health

## 2017-09-08 ENCOUNTER — Encounter: Payer: Self-pay | Admitting: Adult Health

## 2017-09-08 DIAGNOSIS — K5909 Other constipation: Secondary | ICD-10-CM

## 2017-09-08 DIAGNOSIS — K219 Gastro-esophageal reflux disease without esophagitis: Secondary | ICD-10-CM

## 2017-09-08 DIAGNOSIS — J309 Allergic rhinitis, unspecified: Secondary | ICD-10-CM

## 2017-09-08 DIAGNOSIS — F418 Other specified anxiety disorders: Secondary | ICD-10-CM

## 2017-09-08 DIAGNOSIS — E1169 Type 2 diabetes mellitus with other specified complication: Secondary | ICD-10-CM

## 2017-09-08 DIAGNOSIS — J189 Pneumonia, unspecified organism: Secondary | ICD-10-CM | POA: Diagnosis not present

## 2017-09-08 DIAGNOSIS — E119 Type 2 diabetes mellitus without complications: Secondary | ICD-10-CM | POA: Diagnosis not present

## 2017-09-08 DIAGNOSIS — E785 Hyperlipidemia, unspecified: Secondary | ICD-10-CM

## 2017-09-08 NOTE — Progress Notes (Signed)
Location:   [facility] Room Number: [phone] Place of Service:  SNF (31)   CODE STATUS: FULL  Allergies  Allergen Reactions  . Penicillins Other (See Comments) and Rash    Has patient had a PCN reaction causing immediate rash, facial/tongue/throat swelling, SOB or lightheadedness with hypotension: Unknown Has patient had a PCN reaction causing severe rash involving mucus membranes or skin necrosis: Unknown Has patient had a PCN reaction that required hospitalization: Unknown Has patient had a PCN reaction occurring within the last 10 years: Unknown If all of the above answers are "NO", then may proceed with Cephalosporin use.     Chief Complaint  Patient presents with  . Medical Management of Chronic Issues    Dyslipidemia; gerd; diabetes. Weekly follow up for the first 30 days post hospitalization     HPI:  She is a 84 year old short term rehab patient being seen for the management of her chronic illnesses: dyslipidemia; gerd; diabetes. There are no complaints of cough; shortness of breath or uncontrolled pain. There are no nursing concerns at this time.   Past Medical History:  Diagnosis Date  . Cancer ([place])    skin  . Depression   . Diabetes mellitus type 2, uncomplicated ([place])   . GERD (gastroesophageal reflux disease)   . Hyperlipidemia   . Hypertension   . Osteoarthritis   . Osteoporosis   . Renal disorder     Past Surgical History:  Procedure Laterality Date  . TOTAL HIP ARTHROPLASTY      Social History   Socioeconomic History  . Marital status: Widowed    Spouse name: Not on file  . Number of children: Not on file  . Years of education: Not on file  . Highest education level: Not on file  Occupational History    Employer: RETIRED  Social Needs  . Financial resource strain: Not on file  . Food insecurity:    Worry: Not on file    Inability: Not on file  . Transportation needs:    Medical: Not on file    Non-medical: Not  on file  Tobacco Use  . Smoking status: Never Smoker  . Smokeless tobacco: Never Used  Substance and Sexual Activity  . Alcohol use: Never    Frequency: Never  . Drug use: Never  . Sexual activity: Not on file  Lifestyle  . Physical activity:    Days per week: Not on file    Minutes per session: Not on file  . Stress: Not on file  Relationships  . Social connections:    Talks on phone: Not on file    Gets together: Not on file    Attends religious service: Not on file    Active member of club or organization: Not on file    Attends meetings of clubs or organizations: Not on file    Relationship status: Not on file  . Intimate partner violence:    Fear of current or ex partner: Not on file    Emotionally abused: Not on file    Physically abused: Not on file    Forced sexual activity: Not on file  Other Topics Concern  . Not on file  Social History Narrative  . Not on file   History reviewed. No pertinent family history.    VITAL SIGNS BP 120/61   Pulse 83   Temp 98.3 F (36.8 C) (Oral)   Resp 18   Ht 5\' 1"  (1.549 m)  Wt 128 lb 14.4 oz (58.5 kg)   SpO2 99%   BMI 24.36 kg/m   Outpatient Encounter Medications as of 09/08/2017  Medication Sig  . acetaminophen (TYLENOL) 500 MG tablet Take 500-1,000 mg by mouth every 6 (six) hours as needed for mild pain.  . bisacodyl (DULCOLAX) 10 MG suppository Place 10 mg rectally daily as needed.  . cetirizine (ZYRTEC) 10 MG tablet Take 10 mg by mouth daily.  . cholecalciferol (VITAMIN D) 1000 units tablet Take 4,000 Units by mouth daily. Administer 4 tabs Use Floor Stock  . esomeprazole (NEXIUM) 40 MG capsule Take 40 mg by mouth daily.   Marland Kitchen glipiZIDE (GLUCOTROL) 5 MG tablet Take 0.5 tablets (2.5 mg total) by mouth daily.  Marland Kitchen glucosamine-chondroitin 500-400 MG tablet Take 1 tablet by mouth daily.  . Infant Care Products [facility] EX) Apply liberal amount topically to area of skin irritation prn. OK to leave at bedside  .  Multiple Vitamins-Minerals (CENTROVITE) TABS Take 1 tablet by mouth daily.  . OXYGEN Inhale 3 L into the lungs continuous.  Marland Kitchen PARoxetine (PAXIL) 20 MG tablet Take 20 mg by mouth daily.   . polyethylene glycol (MIRALAX / GLYCOLAX) packet Take 17 g by mouth daily.  . simvastatin (ZOCOR) 20 MG tablet Take 20 mg by mouth every evening. for cholesterol  . traMADol (ULTRAM) 50 MG tablet Take 50-100 mg by mouth every 4 (four) hours as needed.  Marland Kitchen UNABLE TO FIND Diet Type: Heart healthy, NCS   No facility-administered encounter medications on file as of 09/08/2017.      SIGNIFICANT DIAGNOSTIC EXAMS  LABS REVIEWED: TODAY:   08-25-17: wbc 9.0; hgb 8.3; hct 24.5; mcv 97.8; plt 152; glucose 188; bun 79; creat 2.88; k+ 3.9; na++; 145; ca 8.1 vit B12: 550; folate 61.2; iron 24 tibc 181 ferritin 54  08-27-17: glucose 111; bun 54; creat 1.87; k+ 4.2 na++ 146; ca 8.0 09-01-17: wbc 10.7; hgb 9.9; hct 29.7; mcv 98.8; plt 190;   Review of Systems  Constitutional: Negative for malaise/fatigue.  Respiratory: Negative for cough and shortness of breath.   Cardiovascular: Negative for chest pain, palpitations and leg swelling.  Gastrointestinal: Negative for abdominal pain, constipation and heartburn.  Musculoskeletal: Negative for back pain, joint pain and myalgias.  Skin: Negative.   Neurological: Negative for dizziness.  Psychiatric/Behavioral: The patient is not nervous/anxious.     Physical Exam  Constitutional: No distress.  Frail   Neck: No thyromegaly present.  Cardiovascular: Normal rate, regular rhythm, normal heart sounds and intact distal pulses.  Pulmonary/Chest: Effort normal and breath sounds normal. No respiratory distress.  Abdominal: Soft. Bowel sounds are normal. She exhibits no distension. There is no tenderness.  Musculoskeletal: Normal range of motion. She exhibits no edema.  Lymphadenopathy:    She has no cervical adenopathy.  Neurological: She is alert.  Skin: Skin is warm and  dry. She is not diaphoretic.  Psychiatric: She has a normal mood and affect.      ASSESSMENT/ PLAN:  TODAY:   1. Dyslipidemia associated with type 2 diabetes mellitus: is stable will continue zocor 20 mg daily   2. Gastroesophageal reflux disease without esophagitis: is stable will continue nexium 40 mg daily   3. Controlled type 2 diabetes mellitus without complication, without long term current use of insulin: is stable will continue glipizide 2.5 mg daily   4. Chronic constipation: is stable will continue miralax daily   5. Chronic allergic rhinitis: is stable will continue zyrtec 10 mg daily  6. Depression with anxiety: is stable will continue paxil 20 mg daily   7. Pneumonia: is stable has completed abt; will wean off 02 as able.    MD is aware of resident's narcotic use and is in agreement with current plan of care. We will attempt to wean resident as apropriate   Ok Edwards NP [facility]  Contact [phone] Monday through Friday 8am- 5pm  After hours call [phone]

## 2017-09-15 ENCOUNTER — Encounter
Admission: RE | Admit: 2017-09-15 | Discharge: 2017-09-15 | Disposition: A | Discharge status: Home / Self Care | Payer: Medicare Other | Source: Ambulatory Visit | Attending: Internal Medicine | Admitting: Internal Medicine

## 2017-09-17 DIAGNOSIS — F418 Other specified anxiety disorders: Secondary | ICD-10-CM | POA: Insufficient documentation

## 2017-09-17 DIAGNOSIS — E119 Type 2 diabetes mellitus without complications: Secondary | ICD-10-CM | POA: Insufficient documentation

## 2017-09-17 DIAGNOSIS — K219 Gastro-esophageal reflux disease without esophagitis: Secondary | ICD-10-CM | POA: Insufficient documentation

## 2017-09-17 DIAGNOSIS — E1169 Type 2 diabetes mellitus with other specified complication: Secondary | ICD-10-CM | POA: Insufficient documentation

## 2017-09-17 DIAGNOSIS — K5909 Other constipation: Secondary | ICD-10-CM | POA: Insufficient documentation

## 2017-09-17 DIAGNOSIS — J309 Allergic rhinitis, unspecified: Secondary | ICD-10-CM | POA: Insufficient documentation

## 2017-09-17 DIAGNOSIS — E785 Hyperlipidemia, unspecified: Secondary | ICD-10-CM

## 2017-09-22 ENCOUNTER — Encounter: Payer: Self-pay | Admitting: Adult Health

## 2017-09-22 ENCOUNTER — Non-Acute Institutional Stay (SKILLED_NURSING_FACILITY): Payer: Medicare Other | Admitting: Adult Health

## 2017-09-22 DIAGNOSIS — R600 Localized edema: Secondary | ICD-10-CM

## 2017-09-22 NOTE — Progress Notes (Signed)
Location:   [facility] Room Number: 774J Place of Service:  SNF (31)   CODE STATUS: FULL  Allergies  Allergen Reactions  . Penicillins Other (See Comments) and Rash    Has patient had a PCN reaction causing immediate rash, facial/tongue/throat swelling, SOB or lightheadedness with hypotension: Unknown Has patient had a PCN reaction causing severe rash involving mucus membranes or skin necrosis: Unknown Has patient had a PCN reaction that required hospitalization: Unknown Has patient had a PCN reaction occurring within the last 10 years: Unknown If all of the above answers are "NO", then may proceed with Cephalosporin use.     Chief Complaint  Patient presents with  . Acute Visit    Edema in Bilateral lower extremities    HPI:  Her family is concerned about her having edema in bilateral lower extremities; and increasing sleeping. She denies feeling tired; states she sleeps at night and feels rested in the morning. She says that she is elevating her legs; however; her feet are down at this time. Staff reports that she will take a nap during the day. The staff has not seen any excessive sleeping.   Past Medical History:  Diagnosis Date  . Cancer ([place])    skin  . Depression   . Diabetes mellitus type 2, uncomplicated ([place])   . GERD (gastroesophageal reflux disease)   . Hyperlipidemia   . Hypertension   . Osteoarthritis   . Osteoporosis   . Renal disorder     Past Surgical History:  Procedure Laterality Date  . TOTAL HIP ARTHROPLASTY      Social History   Socioeconomic History  . Marital status: Widowed    Spouse name: Not on file  . Number of children: Not on file  . Years of education: Not on file  . Highest education level: Not on file  Occupational History    Employer: RETIRED  Social Needs  . Financial resource strain: Not on file  . Food insecurity:    Worry: Not on file    Inability: Not on file  . Transportation needs:   Medical: Not on file    Non-medical: Not on file  Tobacco Use  . Smoking status: Never Smoker  . Smokeless tobacco: Never Used  Substance and Sexual Activity  . Alcohol use: Never    Frequency: Never  . Drug use: Never  . Sexual activity: Not on file  Lifestyle  . Physical activity:    Days per week: Not on file    Minutes per session: Not on file  . Stress: Not on file  Relationships  . Social connections:    Talks on phone: Not on file    Gets together: Not on file    Attends religious service: Not on file    Active member of club or organization: Not on file    Attends meetings of clubs or organizations: Not on file    Relationship status: Not on file  . Intimate partner violence:    Fear of current or ex partner: Not on file    Emotionally abused: Not on file    Physically abused: Not on file    Forced sexual activity: Not on file  Other Topics Concern  . Not on file  Social History Narrative  . Not on file   History reviewed. No pertinent family history.    VITAL SIGNS BP 118/60   Pulse 76   Temp 97.8 F (36.6 C) (Oral)  Resp 20   Ht 5\' 1"  (1.549 m)   Wt 128 lb 14.4 oz (58.5 kg)   SpO2 97%   BMI 24.36 kg/m   Outpatient Encounter Medications as of 09/22/2017  Medication Sig  . acetaminophen (TYLENOL) 500 MG tablet Take 500-1,000 mg by mouth every 6 (six) hours as needed for mild pain.  . bisacodyl (DULCOLAX) 10 MG suppository Place 10 mg rectally daily as needed.  . cetirizine (ZYRTEC) 10 MG tablet Take 10 mg by mouth daily.  . cholecalciferol (VITAMIN D) 1000 units tablet Take 4,000 Units by mouth daily. Administer 4 tabs Use Floor Stock  . esomeprazole (NEXIUM) 40 MG capsule Take 40 mg by mouth daily.   Marland Kitchen glipiZIDE (GLUCOTROL) 5 MG tablet Take 0.5 tablets (2.5 mg total) by mouth daily.  Marland Kitchen glucosamine-chondroitin 500-400 MG tablet Take 1 tablet by mouth daily.  . Infant Care Products [facility] EX) Apply liberal amount topically to area of skin  irritation prn. OK to leave at bedside  . Multiple Vitamins-Minerals (CENTROVITE) TABS Take 1 tablet by mouth daily.  . OXYGEN Inhale 3 L into the lungs continuous.  Marland Kitchen PARoxetine (PAXIL) 20 MG tablet Take 20 mg by mouth daily.   . polyethylene glycol (MIRALAX / GLYCOLAX) packet Take 17 g by mouth daily.  . simvastatin (ZOCOR) 20 MG tablet Take 20 mg by mouth every evening. for cholesterol  . traMADol (ULTRAM) 50 MG tablet Take 50-100 mg by mouth every 4 (four) hours as needed.  Marland Kitchen UNABLE TO FIND Diet Type: Heart healthy, NCS   No facility-administered encounter medications on file as of 09/22/2017.      SIGNIFICANT DIAGNOSTIC EXAMS  LABS REVIEWED: PREVIOUS:   08-25-17: wbc 9.0; hgb 8.3; hct 24.5; mcv 97.8; plt 152; glucose 188; bun 79; creat 2.88; k+ 3.9; na++; 145; ca 8.1 vit B12: 550; folate 61.2; iron 24 tibc 181 ferritin 54  08-27-17: glucose 111; bun 54; creat 1.87; k+ 4.2 na++ 146; ca 8.0 09-01-17: wbc 10.7; hgb 9.9; hct 29.7; mcv 98.8; plt 190;   NO NEW LABS.   Review of Systems  Constitutional: Negative for malaise/fatigue.       She denies feeling tired   Respiratory: Negative for cough and shortness of breath.   Cardiovascular: Negative for chest pain, palpitations and leg swelling.  Gastrointestinal: Negative for abdominal pain, constipation and heartburn.  Musculoskeletal: Negative for back pain, joint pain and myalgias.  Skin: Negative.   Neurological: Negative for dizziness.  Psychiatric/Behavioral: The patient is not nervous/anxious.    Physical Exam  Constitutional: No distress.  Frail   Neck: No thyromegaly present.  Cardiovascular: Normal rate, regular rhythm, normal heart sounds and intact distal pulses.  Pulmonary/Chest: Effort normal and breath sounds normal. No respiratory distress.  Abdominal: Soft. Bowel sounds are normal. She exhibits no distension. There is no tenderness.  Musculoskeletal: Normal range of motion. She exhibits edema.  Has trace bilateral  ankle edema   Lymphadenopathy:    She has no cervical adenopathy.  Neurological: She is alert.  Skin: Skin is warm and dry. She is not diaphoretic.  Psychiatric: She has a normal mood and affect.    ASSESSMENT/ PLAN:  TODAY:   1.  Bilateral lower extremity edema: is slight: I have asked the nursing staff to help encourage her elevate her legs; will put knee ted hose daily .    MD is aware of resident's narcotic use and is in agreement with current plan of care. We will attempt to wean resident  as apropriate   Ok Edwards NP [facility]  Contact [phone] Monday through Friday 8am- 5pm  After hours call [phone]

## 2017-10-04 DIAGNOSIS — R600 Localized edema: Secondary | ICD-10-CM | POA: Insufficient documentation

## 2017-10-12 ENCOUNTER — Encounter: Payer: Self-pay | Admitting: Adult Health

## 2017-10-12 ENCOUNTER — Non-Acute Institutional Stay (SKILLED_NURSING_FACILITY): Payer: Medicare Other | Admitting: Adult Health

## 2017-10-12 DIAGNOSIS — D649 Anemia, unspecified: Secondary | ICD-10-CM | POA: Diagnosis not present

## 2017-10-12 DIAGNOSIS — J309 Allergic rhinitis, unspecified: Secondary | ICD-10-CM

## 2017-10-12 DIAGNOSIS — K219 Gastro-esophageal reflux disease without esophagitis: Secondary | ICD-10-CM

## 2017-10-12 DIAGNOSIS — R600 Localized edema: Secondary | ICD-10-CM

## 2017-10-12 DIAGNOSIS — E119 Type 2 diabetes mellitus without complications: Secondary | ICD-10-CM

## 2017-10-12 DIAGNOSIS — F418 Other specified anxiety disorders: Secondary | ICD-10-CM

## 2017-10-12 DIAGNOSIS — K5909 Other constipation: Secondary | ICD-10-CM

## 2017-10-12 DIAGNOSIS — E1169 Type 2 diabetes mellitus with other specified complication: Secondary | ICD-10-CM | POA: Diagnosis not present

## 2017-10-12 DIAGNOSIS — E785 Hyperlipidemia, unspecified: Secondary | ICD-10-CM

## 2017-10-12 NOTE — Progress Notes (Signed)
Provider:Erial Fikes Nyoka Cowden, NP Location  [facility]  PCP: Dr. Frazier Richards  Extended Emergency Contact Information Primary Emergency Contact: Gorr,Maron Address: [address]          [place], [place] [address] Johnnette Litter of [place] Phone: [phone] Mobile Phone: [phone] Relation: Son Secondary Emergency Contact: [facility] Address: [address] [place]          [place], [place] [address] Johnnette Litter of [place] Phone: [phone] Mobile Phone: [phone] Relation: Daughter  Codes status: FULL Goals of care: advanced directive information Advanced Directives 10/12/2017  Does Patient Have a Medical Advance Directive? Yes  Type of Advance Directive [place]  Does patient want to make changes to medical advance directive? No - Patient declined  Copy of [place] in Chart? Yes     Allergies  Allergen Reactions  . Penicillins Other (See Comments) and Rash    Has patient had a PCN reaction causing immediate rash, facial/tongue/throat swelling, SOB or lightheadedness with hypotension: Unknown Has patient had a PCN reaction causing severe rash involving mucus membranes or skin necrosis: Unknown Has patient had a PCN reaction that required hospitalization: Unknown Has patient had a PCN reaction occurring within the last 10 years: Unknown If all of the above answers are "NO", then may proceed with Cephalosporin use.     Chief Complaint  Patient presents with  . Annual Exam        HPI  She is being seen for her annual review. She is a 84 year old long term resident of this facility. She had been hospitalized in July. Her status remains stable since that time. She denies any sinus congestoin; no weakness or fatigue; denies constipation. She will continue to be followed for her chronic illnesses including: diabetes; dyslipidemia; depression.      Past Medical History:  Diagnosis Date  . Cancer  ([place])    skin  . Depression   . Diabetes mellitus type 2, uncomplicated ([place])   . GERD (gastroesophageal reflux disease)   . Hyperlipidemia   . Hypertension   . Osteoarthritis   . Osteoporosis   . Renal disorder    Past Surgical History:  Procedure Laterality Date  . TOTAL HIP ARTHROPLASTY      reports that she has never smoked. She has never used smokeless tobacco. She reports that she does not drink alcohol or use drugs. Social History   Tobacco Use  . Smoking status: Never Smoker  . Smokeless tobacco: Never Used  Substance Use Topics  . Alcohol use: Never    Frequency: Never  . Drug use: Never   History reviewed. No pertinent family history.  Pertinent  Health Maintenance Due  Topic Date Due  . HEMOGLOBIN A1C  04-May-1926  . FOOT EXAM  10/11/1938  . OPHTHALMOLOGY EXAM  10/11/1938  . URINE MICROALBUMIN  10/11/1938  . PNA vac Low Risk Adult (1 of 2 - PCV13) 10/10/1993  . INFLUENZA VACCINE  09/15/2017  . DEXA SCAN  Completed      Outpatient Encounter Medications as of 10/12/2017  Medication Sig  . acetaminophen (TYLENOL) 500 MG tablet Take 500-1,000 mg by mouth every 6 (six) hours as needed for mild pain.  Marland Kitchen alum & mag hydroxide-simeth (MAALOX PLUS) 400-400-40 MG/5ML suspension Take 30 mLs by mouth every 4 (four) hours as needed.  . bisacodyl (DULCOLAX) 10 MG suppository Place 10 mg rectally daily as needed.  . cetirizine (ZYRTEC) 10 MG tablet Take 10 mg  by mouth daily.  . cholecalciferol (VITAMIN D) 1000 units tablet Take 4,000 Units by mouth daily. Administer 4 tabs Use Floor Stock  . esomeprazole (NEXIUM) 40 MG capsule Take 40 mg by mouth daily.   Marland Kitchen glipiZIDE (GLUCOTROL) 5 MG tablet Take 0.5 tablets (2.5 mg total) by mouth daily.  Marland Kitchen glucosamine-chondroitin 500-400 MG tablet Take 1 tablet by mouth daily.  . Infant Care Products [facility] EX) Apply liberal amount topically to area of skin irritation prn. OK to leave at bedside  . Multiple Vitamins-Minerals  (CENTROVITE) TABS Take 1 tablet by mouth daily.  . OXYGEN Inhale 3 L into the lungs continuous.  Marland Kitchen PARoxetine (PAXIL) 20 MG tablet Take 20 mg by mouth daily.   . polyethylene glycol (MIRALAX / GLYCOLAX) packet Take 17 g by mouth daily.  . simvastatin (ZOCOR) 20 MG tablet Take 20 mg by mouth every evening. for cholesterol  . traMADol (ULTRAM) 50 MG tablet Take 50-100 mg by mouth every 4 (four) hours as needed.  Marland Kitchen UNABLE TO FIND Diet Type: Heart healthy, NCS   No facility-administered encounter medications on file as of 10/12/2017.      Vitals:   10/12/17 1321  BP: 122/80  Pulse: 77  Resp: 20  Temp: (!) 97.5 F (36.4 C)  TempSrc: Oral  SpO2: 98%  Weight: 123 lb 1.6 oz (55.8 kg)  Height: 5\' 1"  (1.549 m)   Body mass index is 23.26 kg/m.    SIGNIFICANT DIAGNOSTIC EXAMS  LABS REVIEWED: PREVIOUS:   08-25-17: wbc 9.0; hgb 8.3; hct 24.5; mcv 97.8; plt 152; glucose 188; bun 79; creat 2.88; k+ 3.9; na++; 145; ca 8.1 vit B12: 550; folate 61.2; iron 24 tibc 181 ferritin 54  08-27-17: glucose 111; bun 54; creat 1.87; k+ 4.2 na++ 146; ca 8.0 09-01-17: wbc 10.7; hgb 9.9; hct 29.7; mcv 98.8; plt 190;   NO NEW LABS.    Review of Systems  Constitutional: Negative for malaise/fatigue.  Respiratory: Negative for cough and shortness of breath.   Cardiovascular: Negative for chest pain, palpitations and leg swelling.  Gastrointestinal: Negative for abdominal pain, constipation and heartburn.  Musculoskeletal: Negative for back pain, joint pain and myalgias.  Skin: Negative.   Neurological: Negative for dizziness.  Psychiatric/Behavioral: The patient is not nervous/anxious.     Physical Exam  Constitutional: No distress.  Frail   Neck: No thyromegaly present.  Cardiovascular: Normal rate, regular rhythm, normal heart sounds and intact distal pulses.  Pulmonary/Chest: Effort normal and breath sounds normal. No respiratory distress.  Abdominal: Soft. Bowel sounds are normal. She exhibits  no distension. There is no tenderness.  Musculoskeletal: Normal range of motion. She exhibits edema.  Trace bilateral lower extremity edema   Lymphadenopathy:    She has no cervical adenopathy.  Neurological: She is alert.  Skin: Skin is warm and dry. She is not diaphoretic.  Psychiatric: She has a normal mood and affect.   ASSESSMENT/PLAN  TODAY:   1. Dyslipidemia associated with type 2 diabetes mellitus: is stable will continue zocor 20 mg daily   2. Gastroesophageal reflux disease without esophagitis: is stable will continue nexium 40 mg daily   3. Controlled type 2 diabetes mellitus without complication, without long term current use of insulin: is stable will continue glipizide 2.5 mg daily   4. Chronic constipation: is stable will continue miralax daily   5. Chronic allergic rhinitis: is stable will continue zyrtec 10 mg daily   6. Depression with anxiety: is stable will continue paxil 20 mg  daily   7. Chronic anemia: is stable hgb 9.9  Will monitor  8. Bilateral lower extremity edema: is stable will not make changes.    HEALTH MAINTENANCE: will need pneumovax; eye exam; will check urine for micro-albumin hgb a1c lipids; mag and liver function.       MD is aware of resident's narcotic use and is in agreement with current plan of care. We will attempt to wean resident as apropriate   Ok Edwards NP [facility]  Contact [phone] Monday through Friday 8am- 5pm  After hours call [phone]

## 2017-10-13 ENCOUNTER — Other Ambulatory Visit
Admission: RE | Admit: 2017-10-13 | Discharge: 2017-10-13 | Disposition: A | Discharge status: Home / Self Care | Payer: Medicare Other | Source: Ambulatory Visit | Attending: Adult Health | Admitting: Adult Health

## 2017-10-13 DIAGNOSIS — E1122 Type 2 diabetes mellitus with diabetic chronic kidney disease: Secondary | ICD-10-CM | POA: Insufficient documentation

## 2017-10-13 LAB — HEPATIC FUNCTION PANEL
ALT: 11 U/L (ref 0–44)
AST: 22 U/L (ref 15–41)
Albumin: 2.9 g/dL — ABNORMAL LOW (ref 3.5–5.0)
Alkaline Phosphatase: 104 U/L (ref 38–126)
BILIRUBIN TOTAL: 0.3 mg/dL (ref 0.3–1.2)
Total Protein: 5.7 g/dL — ABNORMAL LOW (ref 6.5–8.1)

## 2017-10-13 LAB — LIPID PANEL
Cholesterol: 175 mg/dL (ref 0–200)
HDL: 51 mg/dL (ref >40)
LDL CALC: 86 mg/dL (ref 0–99)
TRIGLYCERIDES: 190 mg/dL — AB (ref <150)
Total CHOL/HDL Ratio: 3.4 RATIO
VLDL: 38 mg/dL (ref 0–40)

## 2017-10-13 LAB — MAGNESIUM: MAGNESIUM: 2.5 mg/dL — AB (ref 1.7–2.4)

## 2017-10-16 ENCOUNTER — Encounter
Admission: RE | Admit: 2017-10-16 | Discharge: 2017-10-16 | Disposition: A | Discharge status: Home / Self Care | Payer: Medicare Other | Source: Ambulatory Visit | Attending: Internal Medicine | Admitting: Internal Medicine

## 2017-10-21 DIAGNOSIS — D649 Anemia, unspecified: Secondary | ICD-10-CM | POA: Insufficient documentation

## 2017-10-24 LAB — MICROALBUMIN, URINE: MICROALB UR: 1392.5 ug/mL — AB

## 2017-11-11 ENCOUNTER — Other Ambulatory Visit: Payer: Self-pay

## 2017-11-11 MED ORDER — TRAMADOL HCL 50 MG PO TABS: 50.0000 mg | ORAL_TABLET | ORAL | 0 refills | Status: DC | PRN

## 2017-11-11 NOTE — Telephone Encounter (Signed)
Rx sent to [facility] phone : [phone] , fax : [phone]  

## 2017-11-15 ENCOUNTER — Encounter
Admission: RE | Admit: 2017-11-15 | Discharge: 2017-11-15 | Disposition: A | Discharge status: Home / Self Care | Payer: Medicare Other | Source: Ambulatory Visit | Attending: Internal Medicine | Admitting: Internal Medicine

## 2017-11-21 ENCOUNTER — Non-Acute Institutional Stay (SKILLED_NURSING_FACILITY): Payer: Medicare Other | Admitting: Adult Health

## 2017-11-21 ENCOUNTER — Encounter: Payer: Self-pay | Admitting: Adult Health

## 2017-11-21 DIAGNOSIS — E1169 Type 2 diabetes mellitus with other specified complication: Secondary | ICD-10-CM

## 2017-11-21 DIAGNOSIS — E785 Hyperlipidemia, unspecified: Secondary | ICD-10-CM

## 2017-11-21 DIAGNOSIS — J309 Allergic rhinitis, unspecified: Secondary | ICD-10-CM | POA: Diagnosis not present

## 2017-11-21 DIAGNOSIS — K219 Gastro-esophageal reflux disease without esophagitis: Secondary | ICD-10-CM | POA: Diagnosis not present

## 2017-11-21 DIAGNOSIS — E119 Type 2 diabetes mellitus without complications: Secondary | ICD-10-CM

## 2017-11-21 DIAGNOSIS — F418 Other specified anxiety disorders: Secondary | ICD-10-CM

## 2017-11-21 NOTE — Progress Notes (Signed)
Location:  [facility] Number: [id number] Place of Service:  SNF (31) Provider:  Durenda Age, NP  Patient Care Team: [facility] as PCP - [facility])  Extended Emergency Contact Information Primary Emergency Contact: Hammill,Maron Address: [address]          [place], [place] [address] Johnnette Litter of [place] Phone: [phone] Mobile Phone: [phone] Relation: Son Secondary Emergency Contact: [facility] Address: [place]          [place], [place] [address] Johnnette Litter of [place] Phone: [phone] Mobile Phone: [phone] Relation: Daughter  Code Status:  Full Code  Goals of care: Advanced Directive information Advanced Directives 10/12/2017  Does Patient Have a Medical Advance Directive? Yes  Type of Advance Directive [place]  Does patient want to make changes to medical advance directive? No - Patient declined  Copy of [place] in Chart? Yes     Chief Complaint  Patient presents with  . Medical Management of Chronic Issues    Routine [facility] visit    HPI:  Pt is a 84 y.o. female seen today for medical management of chronic diseases. She has PMH of hypertension, hyperlipidemia, depression, and GERD. She was seen in her room today. She complained of nausea even with Nexium. She said that "whenever she eats, she wants to vomit." She denies having constipation.   Past Medical History:  Diagnosis Date  . Cancer ([place])    skin  . Depression   . Diabetes mellitus type 2, uncomplicated ([place])   . GERD (gastroesophageal reflux disease)   . Hyperlipidemia   . Hypertension   . Osteoarthritis   . Osteoporosis   . Renal disorder    Past Surgical History:  Procedure Laterality Date  . TOTAL HIP ARTHROPLASTY      Allergies  Allergen Reactions  . Penicillins Other (See Comments) and Rash    Has patient had a PCN reaction  causing immediate rash, facial/tongue/throat swelling, SOB or lightheadedness with hypotension: Unknown Has patient had a PCN reaction causing severe rash involving mucus membranes or skin necrosis: Unknown Has patient had a PCN reaction that required hospitalization: Unknown Has patient had a PCN reaction occurring within the last 10 years: Unknown If all of the above answers are "NO", then may proceed with Cephalosporin use.     Outpatient Encounter Medications as of 11/21/2017  Medication Sig  . acetaminophen (TYLENOL) 325 MG tablet Take 650 mg by mouth every 6 (six) hours as needed for mild pain or fever.  Marland Kitchen alum & mag hydroxide-simeth (MAALOX PLUS) 400-400-40 MG/5ML suspension Take 30 mLs by mouth every 4 (four) hours as needed.  . bisacodyl (DULCOLAX) 10 MG suppository Place 10 mg rectally daily as needed.  . cetirizine (ZYRTEC) 10 MG tablet Take 10 mg by mouth daily.  . cholecalciferol (VITAMIN D) 1000 units tablet Take 4,000 Units by mouth daily. Administer 4 tabs Use Floor Stock  . esomeprazole (NEXIUM) 40 MG capsule Take 40 mg by mouth daily.   Marland Kitchen glipiZIDE (GLUCOTROL) 5 MG tablet Take 2.5 mg by mouth daily before breakfast.  . glucosamine-chondroitin 500-400 MG tablet Take 1 tablet by mouth daily.  . Infant Care Products [facility] EX) Apply liberal amount topically to area of skin irritation prn. OK to leave at bedside  . Multiple Vitamins-Minerals (CENTROVITE) TABS Take 1 tablet by mouth daily.   . ondansetron (ZOFRAN) 4 MG tablet Take 4 mg by  mouth every 8 (eight) hours as needed for nausea or vomiting.  . OXYGEN Inhale 3 L into the lungs continuous.  Marland Kitchen PARoxetine (PAXIL) 20 MG tablet Take 20 mg by mouth daily.   . polyethylene glycol (MIRALAX / GLYCOLAX) packet Take 17 g by mouth daily.  . simvastatin (ZOCOR) 20 MG tablet Take 20 mg by mouth every evening. for cholesterol  . UNABLE TO FIND Diet Type: Heart healthy, NCS  . [DISCONTINUED] acetaminophen (TYLENOL) 500 MG tablet  Take 500-1,000 mg by mouth every 6 (six) hours as needed for mild pain.  . [DISCONTINUED] glipiZIDE (GLUCOTROL) 5 MG tablet Take 0.5 tablets (2.5 mg total) by mouth daily.  . [DISCONTINUED] traMADol (ULTRAM) 50 MG tablet Take 1-2 tablets (50-100 mg total) by mouth every 4 (four) hours as needed. (Patient taking differently: Take 50 mg by mouth every 4 (four) hours as needed. )   No facility-administered encounter medications on file as of 11/21/2017.     Review of Systems  GENERAL: No change in appetite, no fatigue, no weight changes, no fever, chills or weakness MOUTH and THROAT: Denies oral discomfort, gingival pain or bleeding RESPIRATORY: no cough, SOB, DOE, wheezing, hemoptysis CARDIAC: No chest pain, edema or palpitations GI: No abdominal pain, diarrhea, constipation, heart burn, nausea or vomiting PSYCHIATRIC: Denies feelings of depression or anxiety. No report of hallucinations, insomnia, paranoia, or agitation    Immunization History  Administered Date(s) Administered  . PPD Test 09/17/2017   Pertinent  Health Maintenance Due  Topic Date Due  . HEMOGLOBIN A1C  10-Dec-1926  . FOOT EXAM  10/11/1938  . OPHTHALMOLOGY EXAM  10/11/1938  . PNA vac Low Risk Adult (1 of 2 - PCV13) 10/10/1993  . INFLUENZA VACCINE  09/15/2017  . URINE MICROALBUMIN  10/14/2018  . DEXA SCAN  Completed      Vitals:   11/21/17 1300 11/21/17 1303  BP: (!) 154/68 (!) 155/78  Pulse: 93   Resp: 18   Temp: (!) 97.2 F (36.2 C)   TempSrc: Oral   SpO2: 100%   Weight: 123 lb 1.6 oz (55.8 kg)   Height: 5\' 1"  (1.549 m)    Body mass index is 23.26 kg/m.  Physical Exam  GENERAL APPEARANCE: Well nourished. In no acute distress. Normal body habitus SKIN:  Skin is warm and dry.  MOUTH and THROAT: Lips are without lesions. Oral mucosa is moist and without lesions. Tongue is normal in shape, size, and color and without lesions RESPIRATORY: Breathing is even & unlabored, BS CTAB CARDIAC: RRR, no  murmur,no extra heart sounds, no edema GI: Abdomen soft, normal BS, no masses, no tenderness EXTREMITIES:  Able to move X 4 extremities PSYCHIATRIC: Alert to self, disoriented to time and place. Affect and behavior are appropriate   Labs reviewed: Recent Labs    08/25/17 0437 08/26/17 0341 08/27/17 0411 10/13/17 0530  NA 145 146* 146*  --   K 3.9 3.7 4.2  --   CL 111 116* 118*  --   CO2 26 23 20*  --   GLUCOSE 188* 125* 111*  --   BUN 79* 68* 54*  --   CREATININE 2.88* 2.21* 1.87*  --   CALCIUM 8.1* 8.0* 8.0*  --   MG  --   --   --  2.5*   Recent Labs    10/13/17 0530  AST 22  ALT 11  ALKPHOS 104  BILITOT 0.3  PROT 5.7*  ALBUMIN 2.9*   Recent Labs  08/25/17 0437 08/26/17 0341 09/01/17 0605  WBC 9.0 9.6 10.7  NEUTROABS  --   --  6.3  HGB 8.3* 7.9* 9.9*  HCT 24.5* 23.6* 29.7*  MCV 97.8 97.7 98.8  PLT 152 156 190    Lab Results  Component Value Date   CHOL 175 10/13/2017   HDL 51 10/13/2017   LDLCALC 86 10/13/2017   TRIG 190 (H) 10/13/2017   CHOLHDL 3.4 10/13/2017    Assessment/Plan  1. Gastroesophageal reflux disease without esophagitis - will discontinue Nexium, start pantoprazole 40 mg DR 1 tab daily   2. Controlled type 2 diabetes mellitus without complication, without long-term current use of insulin (HCC) -well-controlled, continue glipizide 5 mg 1/2 tab = 2.5 mg daily   3. Depression with anxiety -mood is stable, continue paroxetine 20 mg 1 tab daily   4. Chronic allergic rhinitis -stable, continue cetirizine 10 mg 1 tab daily   5. Dyslipidemia associated with type 2 diabetes mellitus ([place]) -continue simvastatin 20 mg 1 tab nightly Lab Results  Component Value Date   CHOL 175 10/13/2017   HDL 51 10/13/2017   LDLCALC 86 10/13/2017   TRIG 190 (H) 10/13/2017   CHOLHDL 3.4 10/13/2017       Family/ staff Communication:   Discussed plan of care with resident.  Labs/tests ordered:   CBC, BMP, hemoglobin A1c  Goals of care:    Long-term care.   Durenda Age, NP [facility] [phone] (Monday-Friday 8:00 a.m. - 5:00 p.m.) [phone] (after hours)

## 2017-11-22 ENCOUNTER — Other Ambulatory Visit
Admission: RE | Admit: 2017-11-22 | Discharge: 2017-11-22 | Disposition: A | Discharge status: Home / Self Care | Payer: Medicare Other | Source: Ambulatory Visit | Attending: Adult Health | Admitting: Adult Health

## 2017-11-22 DIAGNOSIS — D631 Anemia in chronic kidney disease: Secondary | ICD-10-CM | POA: Diagnosis present

## 2017-11-22 LAB — CBC WITH DIFFERENTIAL/PLATELET
BASOS ABS: 0.1 10*3/uL (ref 0–0.1)
Basophils Relative: 1 %
EOS PCT: 5 %
Eosinophils Absolute: 0.4 10*3/uL (ref 0–0.7)
HEMATOCRIT: 33 % — AB (ref 35.0–47.0)
Hemoglobin: 10.8 g/dL — ABNORMAL LOW (ref 12.0–16.0)
LYMPHS PCT: 28 %
Lymphs Abs: 2.2 10*3/uL (ref 1.0–3.6)
MCH: 31.9 pg (ref 26.0–34.0)
MCHC: 32.8 g/dL (ref 32.0–36.0)
MCV: 97.4 fL (ref 80.0–100.0)
Monocytes Absolute: 0.9 10*3/uL (ref 0.2–0.9)
Monocytes Relative: 11 %
Neutro Abs: 4.4 10*3/uL (ref 1.4–6.5)
Neutrophils Relative %: 55 %
Platelets: 184 10*3/uL (ref 150–440)
RBC: 3.39 MIL/uL — AB (ref 3.80–5.20)
RDW: 15.4 % — ABNORMAL HIGH (ref 11.5–14.5)
WBC: 8 10*3/uL (ref 3.6–11.0)

## 2017-11-22 LAB — HEMOGLOBIN A1C
Hgb A1c MFr Bld: 6.5 % — ABNORMAL HIGH (ref 4.8–5.6)
Mean Plasma Glucose: 139.85 mg/dL

## 2017-12-06 ENCOUNTER — Non-Acute Institutional Stay (SKILLED_NURSING_FACILITY): Payer: Medicare Other | Admitting: Adult Health

## 2017-12-06 DIAGNOSIS — G8929 Other chronic pain: Secondary | ICD-10-CM

## 2017-12-06 DIAGNOSIS — E119 Type 2 diabetes mellitus without complications: Secondary | ICD-10-CM | POA: Diagnosis not present

## 2017-12-06 DIAGNOSIS — R110 Nausea: Secondary | ICD-10-CM

## 2017-12-06 NOTE — Progress Notes (Signed)
[facility] [place] Number: [id number] Place of Service:  SNF (31) Provider:  Durenda Age, NP  Patient Care Team: Center, [facility] as PCP - [facility])  Extended Emergency Contact Information Primary Emergency Contact: Ramakrishnan,Maron Address: [address]          [place], [place] [address] Johnnette Litter of [place] Phone: [phone] Mobile Phone: [phone] Relation: Son Secondary Emergency Contact: [facility] Address: [address]          [place], [place] [address] Johnnette Litter of [place] Phone: [phone] Mobile Phone: [phone] Relation: Daughter  Code Status:  Full Code  Goals of care: Advanced Directive information Advanced Directives 10/12/2017  Does Patient Have a Medical Advance Directive? Yes  Type of Advance Directive [place]  Does patient want to make changes to medical advance directive? No - Patient declined  Copy of [place] in Chart? Yes     Chief Complaint  Patient presents with  . Acute Visit    Staff reports patient complaining of nausea.    HPI:  Pt is an 84 y.o. female seen today for an acute visit secondary to staff reporting she has nausea and vomiting. She has a PMH of CKD stage 3, type 2 DM, anemia, GERD, and iron deficiency anemia. She was seen in the room. Today. She was complaining of nausea so she was started on Pantoprazole. She verbalized that her nausea has improved.   Past Medical History:  Diagnosis Date  . Cancer ([place])    skin  . Depression   . Diabetes mellitus type 2, uncomplicated ([place])   . GERD (gastroesophageal reflux disease)   . Hyperlipidemia   . Hypertension   . Osteoarthritis   . Osteoporosis   . Renal disorder    Past Surgical History:  Procedure Laterality Date  . TOTAL HIP ARTHROPLASTY      Allergies  Allergen Reactions  . Penicillins Other (See Comments) and Rash    Has  patient had a PCN reaction causing immediate rash, facial/tongue/throat swelling, SOB or lightheadedness with hypotension: Unknown Has patient had a PCN reaction causing severe rash involving mucus membranes or skin necrosis: Unknown Has patient had a PCN reaction that required hospitalization: Unknown Has patient had a PCN reaction occurring within the last 10 years: Unknown If all of the above answers are "NO", then may proceed with Cephalosporin use.     Outpatient Encounter Medications as of 12/06/2017  Medication Sig  . acetaminophen (TYLENOL) 325 MG tablet Take 650 mg by mouth every 6 (six) hours as needed for mild pain or fever.  Marland Kitchen alum & mag hydroxide-simeth (MAALOX PLUS) 400-400-40 MG/5ML suspension Take 30 mLs by mouth every 4 (four) hours as needed.  . bisacodyl (DULCOLAX) 10 MG suppository Place 10 mg rectally daily as needed.  . cetirizine (ZYRTEC) 10 MG tablet Take 10 mg by mouth daily.  . cholecalciferol (VITAMIN D) 1000 units tablet Take 4,000 Units by mouth daily. Administer 4 tabs Use Floor Stock  . glipiZIDE (GLUCOTROL) 5 MG tablet Take 2.5 mg by mouth daily before breakfast.  . glucosamine-chondroitin 500-400 MG tablet Take 1 tablet by mouth daily.  . Infant Care Products [facility] EX) Apply liberal amount topically to area of skin irritation prn. OK to leave at bedside  . Multiple Vitamins-Minerals (CENTROVITE) TABS Take 1 tablet by mouth daily.   . ondansetron (ZOFRAN) 4 MG tablet Take 4 mg by mouth every 8 (  eight) hours as needed for nausea or vomiting.  . OXYGEN Inhale 3 L into the lungs continuous.  . pantoprazole (PROTONIX) 40 MG tablet Take 40 mg by mouth daily.   Marland Kitchen PARoxetine (PAXIL) 20 MG tablet Take 20 mg by mouth daily.   . polyethylene glycol (MIRALAX / GLYCOLAX) packet Take 17 g by mouth daily.  . simvastatin (ZOCOR) 20 MG tablet Take 20 mg by mouth every evening. for cholesterol  . traMADol (ULTRAM) 50 MG tablet Take by mouth every 6 (six) hours as needed  for moderate pain.  Marland Kitchen UNABLE TO FIND Diet Type: Heart healthy, NCS  . [DISCONTINUED] esomeprazole (NEXIUM) 40 MG capsule Take 40 mg by mouth daily.    No facility-administered encounter medications on file as of 12/06/2017.     Review of Systems  GENERAL: No change in appetite, no fatigue, no weight changes, no fever, chills or weakness MOUTH and THROAT: Denies oral discomfort, gingival pain or bleeding RESPIRATORY: no cough, SOB, DOE, wheezing, hemoptysis CARDIAC: No chest pain, edema or palpitations GI: No abdominal pain, diarrhea, constipation, heart burn, nausea or vomiting PSYCHIATRIC: Denies feelings of depression or anxiety. No report of hallucinations, insomnia, paranoia, or agitation   Immunization History  Administered Date(s) Administered  . PPD Test 09/17/2017   Pertinent  Health Maintenance Due  Topic Date Due  . FOOT EXAM  10/11/1936  . OPHTHALMOLOGY EXAM  10/11/1936  . PNA vac Low Risk Adult (1 of 2 - PCV13) 10/10/1993  . INFLUENZA VACCINE  09/15/2017  . HEMOGLOBIN A1C  05/24/2018  . URINE MICROALBUMIN  10/14/2018  . DEXA SCAN  Completed      Vitals:   12/08/17 1323 12/08/17 1324  BP: (!) 156/64 (!) 188/79  Pulse: 77   Resp: 20   Temp: 97.8 F (36.6 C)   TempSrc: Oral   SpO2: 98%   Weight: 120 lb (54.4 kg)   Height: 5\' 1"  (1.549 m)    Body mass index is 22.67 kg/m.  Physical Exam  GENERAL APPEARANCE: Well nourished. In no acute distress.  SKIN:  Skin is warm and dry.  MOUTH and THROAT: Lips are without lesions. Oral mucosa is moist and without lesions. Tongue is normal in shape, size, and color and without lesions RESPIRATORY: Breathing is even & unlabored, BS CTAB CARDIAC: RRR, no murmur,no extra heart sounds, no edema GI: Abdomen soft, normal BS, no masses, no tenderness EXTREMITIES: Able to move X 4 extremities NEUROLOGICAL: There is no tremor. Speech is clear PSYCHIATRIC: Alert to self, disoriented to time and place Affect and behavior are  appropriate   Labs reviewed: Recent Labs    08/25/17 0437 08/26/17 0341 08/27/17 0411 10/13/17 0530  NA 145 146* 146*  --   K 3.9 3.7 4.2  --   CL 111 116* 118*  --   CO2 26 23 20*  --   GLUCOSE 188* 125* 111*  --   BUN 79* 68* 54*  --   CREATININE 2.88* 2.21* 1.87*  --   CALCIUM 8.1* 8.0* 8.0*  --   MG  --   --   --  2.5*   Recent Labs    10/13/17 0530  AST 22  ALT 11  ALKPHOS 104  BILITOT 0.3  PROT 5.7*  ALBUMIN 2.9*   Recent Labs    08/26/17 0341 09/01/17 0605 11/22/17 0525  WBC 9.6 10.7 8.0  NEUTROABS  --  6.3 4.4  HGB 7.9* 9.9* 10.8*  HCT 23.6* 29.7* 33.0*  MCV  97.7 98.8 97.4  PLT 156 190 184    Lab Results  Component Value Date   HGBA1C 6.5 (H) 11/22/2017   Lab Results  Component Value Date   CHOL 175 10/13/2017   HDL 51 10/13/2017   LDLCALC 86 10/13/2017   TRIG 190 (H) 10/13/2017   CHOLHDL 3.4 10/13/2017     Assessment/Plan  1. Nausea -better, continue pantoprazole 40 mg 1 tablet daily   2. Controlled type 2 diabetes mellitus without complication, without long-term current use of insulin (HCC) -continue glipizide 5 mg 1/2 tab = 2.5 mg daily Lab Results  Component Value Date   HGBA1C 6.5 (H) 11/22/2017    3. Other chronic pain -well-controlled, continue tramadol 50 mg 1 tab every 6 hours as needed and acetaminophen 325 mg 2 tabs= 650 mg every 6 hours as needed    Family/ staff Communication: Discussed plan of care with resident  Labs/tests ordered: None  Goals of care:   Long-term care.   Durenda Age, NP [facility] [phone] (Monday-Friday 8:00 a.m. - 5:00 p.m.) [phone] (after hours)

## 2017-12-08 ENCOUNTER — Encounter: Payer: Self-pay | Admitting: Adult Health

## 2017-12-13 ENCOUNTER — Non-Acute Institutional Stay (SKILLED_NURSING_FACILITY): Payer: Medicare Other | Admitting: Adult Health

## 2017-12-13 ENCOUNTER — Encounter: Payer: Self-pay | Admitting: Adult Health

## 2017-12-13 DIAGNOSIS — R110 Nausea: Secondary | ICD-10-CM | POA: Insufficient documentation

## 2017-12-13 DIAGNOSIS — F418 Other specified anxiety disorders: Secondary | ICD-10-CM

## 2017-12-13 DIAGNOSIS — G8929 Other chronic pain: Secondary | ICD-10-CM

## 2017-12-13 DIAGNOSIS — M545 Low back pain: Secondary | ICD-10-CM | POA: Diagnosis not present

## 2017-12-13 NOTE — Progress Notes (Signed)
[facility] Room Number: [place] of Service:  SNF (31)   CODE STATUS: Full Code  Allergies  Allergen Reactions  . Penicillins Other (See Comments) and Rash    Has patient had a PCN reaction causing immediate rash, facial/tongue/throat swelling, SOB or lightheadedness with hypotension: Unknown Has patient had a PCN reaction causing severe rash involving mucus membranes or skin necrosis: Unknown Has patient had a PCN reaction that required hospitalization: Unknown Has patient had a PCN reaction occurring within the last 10 years: Unknown If all of the above answers are "NO", then may proceed with Cephalosporin use.     Chief Complaint  Patient presents with  . Acute Visit    Anxiety / Pain    HPI:  Staff reports that she is having back pain and anxiety. She tells me that has chronic back pain; but is having worse pain over the past several days. She is not getting adequate relief. Denies any pain radiating to her groin or lower extremities. She also has chronic anxiety and feels as though she is worrying too much. She is able to sleep at night and denies any changes in appetite.   Past Medical History:  Diagnosis Date  . Cancer ([place])    skin  . Depression   . Diabetes mellitus type 2, uncomplicated ([place])   . GERD (gastroesophageal reflux disease)   . Hyperlipidemia   . Hypertension   . Osteoarthritis   . Osteoporosis   . Renal disorder     Past Surgical History:  Procedure Laterality Date  . TOTAL HIP ARTHROPLASTY      Social History   Socioeconomic History  . Marital status: Widowed    Spouse name: Not on file  . Number of children: Not on file  . Years of education: Not on file  . Highest education level: Not on file  Occupational History    Employer: RETIRED  Social Needs  . Financial resource strain: Not on file  . Food insecurity:    Worry: Not on file    Inability: Not on file  . Transportation needs:   Medical: Not on file    Non-medical: Not on file  Tobacco Use  . Smoking status: Never Smoker  . Smokeless tobacco: Never Used  Substance and Sexual Activity  . Alcohol use: Never    Frequency: Never  . Drug use: Never  . Sexual activity: Not on file  Lifestyle  . Physical activity:    Days per week: Not on file    Minutes per session: Not on file  . Stress: Not on file  Relationships  . Social connections:    Talks on phone: Not on file    Gets together: Not on file    Attends religious service: Not on file    Active member of club or organization: Not on file    Attends meetings of clubs or organizations: Not on file    Relationship status: Not on file  . Intimate partner violence:    Fear of current or ex partner: Not on file    Emotionally abused: Not on file    Physically abused: Not on file    Forced sexual activity: Not on file  Other Topics Concern  . Not on file  Social History Narrative  . Not on file   No family history on file.    VITAL SIGNS BP (!) 159/60   Pulse 80   Temp 97.6  F (36.4 C)   Resp 18   Ht 5\' 1"  (1.549 m)   Wt 120 lb (54.4 kg)   SpO2 98%   BMI 22.67 kg/m   Outpatient Encounter Medications as of 12/13/2017  Medication Sig  . acetaminophen (TYLENOL) 325 MG tablet Take 650 mg by mouth every 6 (six) hours as needed for mild pain or fever.  Marland Kitchen alum & mag hydroxide-simeth (MAALOX PLUS) 400-400-40 MG/5ML suspension Take 30 mLs by mouth every 4 (four) hours as needed.  Marland Kitchen amLODipine (NORVASC) 2.5 MG tablet Take 2.5 mg by mouth daily.  . bisacodyl (DULCOLAX) 10 MG suppository Place 10 mg rectally daily as needed.  . cetirizine (ZYRTEC) 10 MG tablet Take 10 mg by mouth daily.  . cholecalciferol (VITAMIN D) 1000 units tablet Take 4,000 Units by mouth daily. Administer 4 tabs Use Floor Stock  . glipiZIDE (GLUCOTROL) 5 MG tablet Take 2.5 mg by mouth daily before breakfast.  . glucosamine-chondroitin 500-400 MG tablet Take 1 tablet by mouth daily.   . Infant Care Products [facility] EX) Apply liberal amount topically to area of skin irritation prn. OK to leave at bedside  . Multiple Vitamins-Minerals (CENTROVITE) TABS Take 1 tablet by mouth daily.   . ondansetron (ZOFRAN) 4 MG tablet Take 4 mg by mouth every 8 (eight) hours as needed for nausea or vomiting.  . OXYGEN Inhale 3 L into the lungs continuous.  . pantoprazole (PROTONIX) 40 MG tablet Take 40 mg by mouth daily.   Marland Kitchen PARoxetine (PAXIL) 20 MG tablet Take 20 mg by mouth daily.   . polyethylene glycol (MIRALAX / GLYCOLAX) packet Take 17 g by mouth daily.  . simvastatin (ZOCOR) 20 MG tablet Take 20 mg by mouth every evening. for cholesterol  . traMADol (ULTRAM) 50 MG tablet Take by mouth every 6 (six) hours as needed for moderate pain.  Marland Kitchen UNABLE TO FIND Diet Type: Heart healthy, NCS   No facility-administered encounter medications on file as of 12/13/2017.      SIGNIFICANT DIAGNOSTIC EXAMS  LABS REVIEWED: PREVIOUS:   08-25-17: wbc 9.0; hgb 8.3; hct 24.5; mcv 97.8; plt 152; glucose 188; bun 79; creat 2.88; k+ 3.9; na++; 145; ca 8.1 vit B12: 550; folate 61.2; iron 24 tibc 181 ferritin 54  08-27-17: glucose 111; bun 54; creat 1.87; k+ 4.2 na++ 146; ca 8.0 09-01-17: wbc 10.7; hgb 9.9; hct 29.7; mcv 98.8; plt 190;   TODAY  10-13-17: liver normal albumin 2.9; chol 175; ldl 86; trig 190; hdl 51; mag 2.5; urine microalbumin: 1392.5 11-22-17: wbc 8.0; hgb 10.8; hct 33.0; mcv 97.4; plt 184; hgb a1c 6.5     Review of Systems  Constitutional: Negative for malaise/fatigue.  Respiratory: Negative for cough and shortness of breath.   Cardiovascular: Negative for chest pain, palpitations and leg swelling.  Gastrointestinal: Negative for abdominal pain, constipation and heartburn.  Musculoskeletal: Positive for back pain. Negative for joint pain and myalgias.  Skin: Negative.   Neurological: Negative for dizziness.  Psychiatric/Behavioral: The patient is nervous/anxious.        "worries  too much"    Physical Exam  Constitutional: No distress.  Frail   Neck: No thyromegaly present.  Cardiovascular: Normal rate, regular rhythm, normal heart sounds and intact distal pulses.  Pulmonary/Chest: Effort normal and breath sounds normal. No respiratory distress.  Abdominal: Soft. Bowel sounds are normal. She exhibits no distension. There is no tenderness.  Musculoskeletal: Normal range of motion. She exhibits edema.  Trace bilateral lower extremity edema  Has overall back tenderness to palpation   Lymphadenopathy:    She has no cervical adenopathy.  Neurological: She is alert.  Skin: Skin is warm and dry. She is not diaphoretic.  Psychiatric: She has a normal mood and affect.     ASSESSMENT/PLAN  TODAY:   1.  Depression with anxiety 2.  Chronic bilateral back pain without sciatica   Will use a lidoderm 4% patch daily  Will being buspar 5 mg daily and will monitor her status.      MD is aware of resident's narcotic use and is in agreement with current plan of care. We will attempt to wean resident as apropriate   Ok Edwards NP [facility]  Contact [phone] Monday through Friday 8am- 5pm  After hours call [phone]

## 2017-12-16 ENCOUNTER — Encounter
Admission: RE | Admit: 2017-12-16 | Discharge: 2017-12-16 | Disposition: A | Discharge status: Home / Self Care | Payer: Medicare Other | Source: Ambulatory Visit | Attending: Internal Medicine | Admitting: Internal Medicine

## 2017-12-18 DIAGNOSIS — M549 Dorsalgia, unspecified: Secondary | ICD-10-CM

## 2017-12-18 DIAGNOSIS — G8929 Other chronic pain: Secondary | ICD-10-CM | POA: Insufficient documentation

## 2017-12-26 ENCOUNTER — Non-Acute Institutional Stay (SKILLED_NURSING_FACILITY): Payer: Medicare Other | Admitting: Adult Health

## 2017-12-26 ENCOUNTER — Encounter: Payer: Self-pay | Admitting: Adult Health

## 2017-12-26 DIAGNOSIS — E1169 Type 2 diabetes mellitus with other specified complication: Secondary | ICD-10-CM

## 2017-12-26 DIAGNOSIS — K219 Gastro-esophageal reflux disease without esophagitis: Secondary | ICD-10-CM

## 2017-12-26 DIAGNOSIS — K5909 Other constipation: Secondary | ICD-10-CM | POA: Diagnosis not present

## 2017-12-26 DIAGNOSIS — E785 Hyperlipidemia, unspecified: Secondary | ICD-10-CM

## 2017-12-26 DIAGNOSIS — E119 Type 2 diabetes mellitus without complications: Secondary | ICD-10-CM | POA: Diagnosis not present

## 2017-12-26 NOTE — Progress Notes (Signed)
[facility] Room Number: [place] of Service:  SNF (31)   CODE STATUS: Full Code  Allergies  Allergen Reactions  . Penicillins Other (See Comments) and Rash    Has patient had a PCN reaction causing immediate rash, facial/tongue/throat swelling, SOB or lightheadedness with hypotension: Unknown Has patient had a PCN reaction causing severe rash involving mucus membranes or skin necrosis: Unknown Has patient had a PCN reaction that required hospitalization: Unknown Has patient had a PCN reaction occurring within the last 10 years: Unknown If all of the above answers are "NO", then may proceed with Cephalosporin use.     Chief Complaint  Patient presents with  . Medical Management of Chronic Issues    Controlled type 2 diabetes mellitus without complication without long term use of insulin; dyslipidemia; associated with type 2 diabetes mellitus; gastroesophageal reflux disease without esophagitis; chronic constipation      HPI:  She is a 84 year old long term resident of this facility being seen for the management of her chronic illnesses: diabetes; dyslipidemia; gerd constipation. She denies any heart burn no constipation no uncontrolled pain. Her hgb a1c is 6.5; which is too tightly controlled for her age group due to increased risk of hypoglycemia.   Past Medical History:  Diagnosis Date  . Cancer ([place])    skin  . Depression   . Diabetes mellitus type 2, uncomplicated ([employer])   . GERD (gastroesophageal reflux disease)   . Hyperlipidemia   . Hypertension   . Osteoarthritis   . Osteoporosis   . Renal disorder     Past Surgical History:  Procedure Laterality Date  . TOTAL HIP ARTHROPLASTY      Social History   Socioeconomic History  . Marital status: Widowed    Spouse name: Not on file  . Number of children: Not on file  . Years of education: Not on file  . Highest education level: Not on file  Occupational History   Employer: RETIRED  Social Needs  . Financial resource strain: Not on file  . Food insecurity:    Worry: Not on file    Inability: Not on file  . Transportation needs:    Medical: Not on file    Non-medical: Not on file  Tobacco Use  . Smoking status: Never Smoker  . Smokeless tobacco: Never Used  Substance and Sexual Activity  . Alcohol use: Never    Frequency: Never  . Drug use: Never  . Sexual activity: Not on file  Lifestyle  . Physical activity:    Days per week: Not on file    Minutes per session: Not on file  . Stress: Not on file  Relationships  . Social connections:    Talks on phone: Not on file    Gets together: Not on file    Attends religious service: Not on file    Active member of club or organization: Not on file    Attends meetings of clubs or organizations: Not on file    Relationship status: Not on file  . Intimate partner violence:    Fear of current or ex partner: Not on file    Emotionally abused: Not on file    Physically abused: Not on file    Forced sexual activity: Not on file  Other Topics Concern  . Not on file  Social History Narrative  . Not on file   History reviewed. No pertinent family history.  VITAL SIGNS BP (!) 150/78   Pulse 77   Temp (!) 97 F (36.1 C)   Resp 18   Ht 5\' 1"  (1.549 m)   Wt 117 lb 11.2 oz (53.4 kg)   SpO2 99%   BMI 22.24 kg/m   Outpatient Encounter Medications as of 12/26/2017  Medication Sig  . acetaminophen (TYLENOL) 325 MG tablet Take 650 mg by mouth every 6 (six) hours as needed for mild pain or fever.  Marland Kitchen alum & mag hydroxide-simeth (MAALOX PLUS) 400-400-40 MG/5ML suspension Take 30 mLs by mouth every 4 (four) hours as needed.  Marland Kitchen amLODipine (NORVASC) 2.5 MG tablet Take 2.5 mg by mouth daily.  . bisacodyl (DULCOLAX) 10 MG suppository Place 10 mg rectally daily as needed.  . busPIRone (BUSPAR) 5 MG tablet Take 5 mg by mouth daily.  . cetirizine (ZYRTEC) 10 MG tablet Take 10 mg by mouth daily.  .  cholecalciferol (VITAMIN D) 1000 units tablet Take 4,000 Units by mouth daily. Administer 4 tabs Use Floor Stock  . glipiZIDE (GLUCOTROL) 5 MG tablet Take 2.5 mg by mouth daily before breakfast.  . glucosamine-chondroitin 500-400 MG tablet Take 1 tablet by mouth daily.  . Infant Care Products [facility] EX) Apply liberal amount topically to area of skin irritation prn. OK to leave at bedside  . Lidocaine (ASPERCREME LIDOCAINE) 4 % PTCH Apply 1 patch topically daily. Apply to lower back in the AM and remove in the PM  . Multiple Vitamins-Minerals (CENTROVITE) TABS Take 1 tablet by mouth daily.   . Nutritional Supplements (NUTRITIONAL SUPPLEMENT PO) Sugar Free Mighty shakes - Give by mouth three times daily with meals  . ondansetron (ZOFRAN) 4 MG tablet Take 4 mg by mouth every 8 (eight) hours as needed for nausea or vomiting.  . OXYGEN Inhale 3 L into the lungs continuous.  . pantoprazole (PROTONIX) 40 MG tablet Take 40 mg by mouth daily.   Marland Kitchen PARoxetine (PAXIL) 20 MG tablet Take 20 mg by mouth daily.   . polyethylene glycol (MIRALAX / GLYCOLAX) packet Take 17 g by mouth daily.  . simvastatin (ZOCOR) 20 MG tablet Take 20 mg by mouth every evening. for cholesterol  . traMADol (ULTRAM) 50 MG tablet Take by mouth every 6 (six) hours as needed for moderate pain.  Marland Kitchen UNABLE TO FIND Diet Type: Heart healthy, NCS   No facility-administered encounter medications on file as of 12/26/2017.      SIGNIFICANT DIAGNOSTIC EXAMS  LABS REVIEWED: PREVIOUS:   08-25-17: wbc 9.0; hgb 8.3; hct 24.5; mcv 97.8; plt 152; glucose 188; bun 79; creat 2.88; k+ 3.9; na++; 145; ca 8.1 vit B12: 550; folate 61.2; iron 24 tibc 181 ferritin 54  08-27-17: glucose 111; bun 54; creat 1.87; k+ 4.2 na++ 146; ca 8.0 09-01-17: wbc 10.7; hgb 9.9; hct 29.7; mcv 98.8; plt 190;  10-13-17: liver normal albumin 2.9; chol 175; ldl 86; trig 190; hdl 51; mag 2.5; urine microalbumin: 1392.5 11-22-17: wbc 8.0; hgb 10.8; hct 33.0; mcv 97.4; plt  184; hgb a1c 6.5   NO NEW LABS.    Review of Systems  Constitutional: Negative for malaise/fatigue.  Respiratory: Negative for cough and shortness of breath.   Cardiovascular: Negative for chest pain, palpitations and leg swelling.  Gastrointestinal: Negative for abdominal pain, constipation and heartburn.  Musculoskeletal: Negative for back pain, joint pain and myalgias.  Skin: Negative.   Neurological: Negative for dizziness.  Psychiatric/Behavioral: The patient is not nervous/anxious.      Physical Exam  Constitutional:  No distress.  Frail   Neck: No thyromegaly present.  Cardiovascular: Normal rate, regular rhythm, normal heart sounds and intact distal pulses.  Pulmonary/Chest: Effort normal and breath sounds normal. No respiratory distress.  Abdominal: Soft. Bowel sounds are normal. She exhibits no distension. There is no tenderness.  Musculoskeletal: She exhibits no edema.  Is able to move all extremities Trace bilateral lower extremity edema   Lymphadenopathy:    She has no cervical adenopathy.  Neurological: She is alert.  Skin: Skin is warm and dry. She is not diaphoretic.  Psychiatric: She has a normal mood and affect.     ASSESSMENT/PLAN  TODAY:   1. Dyslipidemia associated with type 2 diabetes mellitus: is stable LDL 86will continue zocor 20 mg daily   2. Gastroesophageal reflux disease without esophagitis: is stable will continue protonix 40 mg daily   3. Controlled type 2 diabetes mellitus without complication, without long term current use of insulin: is stable hgb a1c 6.5  Will stop glipizide at this time and will monitor   4. Chronic constipation: is stable will continue miralax daily   PREVIOUS   5. Chronic allergic rhinitis: is stable will continue zyrtec 10 mg daily   6. Depression with anxiety: is stable will continue paxil 20 mg daily and buspar 5 mg daily   7. Chronic anemia: is stable hgb 9.9  Will monitor  8. Bilateral lower extremity  edema: is stable will not make changes.         MD is aware of resident's narcotic use and is in agreement with current plan of care. We will attempt to wean resident as apropriate   Ok Edwards NP [facility]  Contact [phone] Monday through Friday 8am- 5pm  After hours call [phone]

## 2018-01-15 ENCOUNTER — Encounter
Admission: RE | Admit: 2018-01-15 | Discharge: 2018-01-15 | Disposition: A | Discharge status: Home / Self Care | Payer: Medicare Other | Source: Ambulatory Visit | Attending: Internal Medicine | Admitting: Internal Medicine

## 2018-01-17 ENCOUNTER — Non-Acute Institutional Stay (SKILLED_NURSING_FACILITY): Payer: Medicare Other | Admitting: Adult Health

## 2018-01-17 ENCOUNTER — Encounter: Payer: Self-pay | Admitting: Adult Health

## 2018-01-17 DIAGNOSIS — E1169 Type 2 diabetes mellitus with other specified complication: Secondary | ICD-10-CM | POA: Diagnosis not present

## 2018-01-17 DIAGNOSIS — E119 Type 2 diabetes mellitus without complications: Secondary | ICD-10-CM | POA: Diagnosis not present

## 2018-01-17 DIAGNOSIS — E785 Hyperlipidemia, unspecified: Secondary | ICD-10-CM

## 2018-01-17 NOTE — Progress Notes (Signed)
Location:   [place] Room Number: 300 Place of Service:  SNF (31)   CODE STATUS: full code   Allergies  Allergen Reactions  . Penicillins Other (See Comments) and Rash    Has patient had a PCN reaction causing immediate rash, facial/tongue/throat swelling, SOB or lightheadedness with hypotension: Unknown Has patient had a PCN reaction causing severe rash involving mucus membranes or skin necrosis: Unknown Has patient had a PCN reaction that required hospitalization: Unknown Has patient had a PCN reaction occurring within the last 10 years: Unknown If all of the above answers are "NO", then may proceed with Cephalosporin use.     Chief Complaint  Patient presents with  . Acute Visit    diabetes     HPI:  Her cbg readings all elevated over 200. Her glipizide had been stopped to her low hgb a1c and increased risk of hypoglycemia. She does require further treatment of her diabetes. She denies any excessive hunger or thirst; and denies any anxiety or agitated feelings.    Past Medical History:  Diagnosis Date  . Cancer ([place])    skin  . Depression   . Diabetes mellitus type 2, uncomplicated ([place])   . GERD (gastroesophageal reflux disease)   . Hyperlipidemia   . Hypertension   . Osteoarthritis   . Osteoporosis   . Renal disorder     Past Surgical History:  Procedure Laterality Date  . TOTAL HIP ARTHROPLASTY      Social History   Socioeconomic History  . Marital status: Widowed    Spouse name: Not on file  . Number of children: Not on file  . Years of education: Not on file  . Highest education level: Not on file  Occupational History    Employer: RETIRED  Social Needs  . Financial resource strain: Not on file  . Food insecurity:    Worry: Not on file    Inability: Not on file  . Transportation needs:    Medical: Not on file    Non-medical: Not on file  Tobacco Use  . Smoking status: Never Smoker  . Smokeless tobacco: Never Used  Substance  and Sexual Activity  . Alcohol use: Never    Frequency: Never  . Drug use: Never  . Sexual activity: Not on file  Lifestyle  . Physical activity:    Days per week: Not on file    Minutes per session: Not on file  . Stress: Not on file  Relationships  . Social connections:    Talks on phone: Not on file    Gets together: Not on file    Attends religious service: Not on file    Active member of club or organization: Not on file    Attends meetings of clubs or organizations: Not on file    Relationship status: Not on file  . Intimate partner violence:    Fear of current or ex partner: Not on file    Emotionally abused: Not on file    Physically abused: Not on file    Forced sexual activity: Not on file  Other Topics Concern  . Not on file  Social History Narrative  . Not on file   History reviewed. No pertinent family history.    VITAL SIGNS BP (!) 146/72   Pulse 90   Temp 98.5 F (36.9 C)   Resp 18   Ht 5\' 1"  (1.549 m)   Wt 119 lb 6.4 oz (54.2 kg)   SpO2  97%   BMI 22.56 kg/m   Outpatient Encounter Medications as of 01/17/2018  Medication Sig  . acetaminophen (TYLENOL) 325 MG tablet Take 650 mg by mouth every 6 (six) hours as needed for mild pain or fever.  Marland Kitchen alum & mag hydroxide-simeth (MAALOX PLUS) 400-400-40 MG/5ML suspension Take 30 mLs by mouth every 4 (four) hours as needed.  Marland Kitchen amLODipine (NORVASC) 2.5 MG tablet Take 2.5 mg by mouth daily.  . bisacodyl (DULCOLAX) 10 MG suppository Place 10 mg rectally daily as needed.  . busPIRone (BUSPAR) 5 MG tablet Take 5 mg by mouth daily.  . cetirizine (ZYRTEC) 10 MG tablet Take 10 mg by mouth daily.  . cholecalciferol (VITAMIN D) 1000 units tablet Take 4,000 Units by mouth daily. Administer 4 tabs Use Floor Stock  . glucosamine-chondroitin 500-400 MG tablet Take 1 tablet by mouth daily.  . Infant Care Products [facility] EX) Apply liberal amount topically to area of skin irritation prn. OK to leave at bedside  .  Lidocaine (ASPERCREME LIDOCAINE) 4 % PTCH Apply 1 patch topically daily. Apply to lower back in the AM and remove in the PM  . Multiple Vitamins-Minerals (CENTROVITE) TABS Take 1 tablet by mouth daily.   . Nutritional Supplements (NUTRITIONAL SUPPLEMENT PO) Sugar Free Mighty shakes - Give by mouth three times daily with meals  . ondansetron (ZOFRAN) 4 MG tablet Take 4 mg by mouth every 8 (eight) hours as needed for nausea or vomiting.  . OXYGEN Inhale 3 L into the lungs continuous.  . pantoprazole (PROTONIX) 40 MG tablet Take 40 mg by mouth daily.   Marland Kitchen PARoxetine (PAXIL) 20 MG tablet Take 20 mg by mouth daily.   . polyethylene glycol (MIRALAX / GLYCOLAX) packet Take 17 g by mouth daily.  . simvastatin (ZOCOR) 20 MG tablet Take 20 mg by mouth every evening. for cholesterol  . traMADol (ULTRAM) 50 MG tablet Take by mouth every 6 (six) hours as needed for moderate pain.  Marland Kitchen UNABLE TO FIND Diet Type: Heart healthy, NCS  . [DISCONTINUED] glipiZIDE (GLUCOTROL) 5 MG tablet Take 2.5 mg by mouth daily before breakfast.   No facility-administered encounter medications on file as of 01/17/2018.      SIGNIFICANT DIAGNOSTIC EXAMS   LABS REVIEWED: PREVIOUS:   08-25-17: wbc 9.0; hgb 8.3; hct 24.5; mcv 97.8; plt 152; glucose 188; bun 79; creat 2.88; k+ 3.9; na++; 145; ca 8.1 vit B12: 550; folate 61.2; iron 24 tibc 181 ferritin 54  08-27-17: glucose 111; bun 54; creat 1.87; k+ 4.2 na++ 146; ca 8.0 09-01-17: wbc 10.7; hgb 9.9; hct 29.7; mcv 98.8; plt 190;  10-13-17: liver normal albumin 2.9; chol 175; ldl 86; trig 190; hdl 51; mag 2.5; urine microalbumin: 1392.5 11-22-17: wbc 8.0; hgb 10.8; hct 33.0; mcv 97.4; plt 184; hgb a1c 6.5   NO NEW LABS.    Review of Systems  Constitutional: Negative for malaise/fatigue.  Respiratory: Negative for cough and shortness of breath.   Cardiovascular: Negative for chest pain, palpitations and leg swelling.  Gastrointestinal: Negative for abdominal pain, constipation and  heartburn.  Musculoskeletal: Negative for back pain, joint pain and myalgias.  Skin: Negative.   Neurological: Negative for dizziness.  Endo/Heme/Allergies: Negative for polydipsia.  Psychiatric/Behavioral: The patient is not nervous/anxious.     Physical Exam  Constitutional: No distress.  Frail   Neck: No thyromegaly present.  Cardiovascular: Normal rate, regular rhythm, normal heart sounds and intact distal pulses.  Pulmonary/Chest: Effort normal and breath sounds normal. No respiratory distress.  Abdominal: Soft. Bowel sounds are normal. She exhibits no distension. There is no tenderness.  Musculoskeletal: She exhibits edema.  Is able to move all extremities Trace bilateral lower extremity edema    Lymphadenopathy:    She has no cervical adenopathy.  Neurological: She is alert.  Skin: Skin is warm and dry. She is not diaphoretic.  Psychiatric: She has a normal mood and affect.     ASSESSMENT/PLAN  TODAY:   1. Dyslipidemia associated with type 2 diabetes mellitus: is stable LDL 86will continue zocor 20 mg daily   3. Controlled type 2 diabetes mellitus without complication, without long term current use of insulin: is worse; will begin lantus 10 units nightly and will check hgb a1c     MD is aware of resident's narcotic use and is in agreement with current plan of care. We will attempt to wean resident as apropriate   Ok Edwards NP [facility]  Contact [phone] Monday through Friday 8am- 5pm  After hours call [phone]

## 2018-01-18 ENCOUNTER — Other Ambulatory Visit
Admission: RE | Admit: 2018-01-18 | Discharge: 2018-01-18 | Disposition: A | Discharge status: Home / Self Care | Payer: Medicare Other | Source: Ambulatory Visit | Attending: Adult Health | Admitting: Adult Health

## 2018-01-18 DIAGNOSIS — E1122 Type 2 diabetes mellitus with diabetic chronic kidney disease: Secondary | ICD-10-CM | POA: Diagnosis present

## 2018-01-18 LAB — HEMOGLOBIN A1C
HEMOGLOBIN A1C: 8 % — AB (ref 4.8–5.6)
Mean Plasma Glucose: 182.9 mg/dL

## 2018-01-26 ENCOUNTER — Non-Acute Institutional Stay (SKILLED_NURSING_FACILITY): Payer: Medicare Other | Admitting: Adult Health

## 2018-01-26 ENCOUNTER — Encounter: Payer: Self-pay | Admitting: Adult Health

## 2018-01-26 DIAGNOSIS — R600 Localized edema: Secondary | ICD-10-CM | POA: Diagnosis not present

## 2018-01-26 DIAGNOSIS — J309 Allergic rhinitis, unspecified: Secondary | ICD-10-CM | POA: Diagnosis not present

## 2018-01-26 DIAGNOSIS — D649 Anemia, unspecified: Secondary | ICD-10-CM

## 2018-01-26 DIAGNOSIS — F418 Other specified anxiety disorders: Secondary | ICD-10-CM

## 2018-01-26 NOTE — Progress Notes (Signed)
Location:   [facility] Room Number: [place] of Service:  SNF (31)   CODE STATUS: Full Code  Allergies  Allergen Reactions  . Penicillins Other (See Comments) and Rash    Has patient had a PCN reaction causing immediate rash, facial/tongue/throat swelling, SOB or lightheadedness with hypotension: Unknown Has patient had a PCN reaction causing severe rash involving mucus membranes or skin necrosis: Unknown Has patient had a PCN reaction that required hospitalization: Unknown Has patient had a PCN reaction occurring within the last 10 years: Unknown If all of the above answers are "NO", then may proceed with Cephalosporin use.     Chief Complaint  Patient presents with  . Medical Management of Chronic Issues    Chronic allergic rhinitis; depression with anxiety; chronic anemia; bilateral lower extremity edema.     HPI:  She is a 84 year old long term resident of this facility being seen for the management of her chronic illnesses: allergic rhinitis; depression with anxiety; anemia edema. She denies any sinus congestion; no cough; no changes in appetite; no anxiety.    Past Medical History:  Diagnosis Date  . Cancer ([place])    skin  . Depression   . Diabetes mellitus type 2, uncomplicated ([place])   . GERD (gastroesophageal reflux disease)   . Hyperlipidemia   . Hypertension   . Osteoarthritis   . Osteoporosis   . Renal disorder     Past Surgical History:  Procedure Laterality Date  . TOTAL HIP ARTHROPLASTY      Social History   Socioeconomic History  . Marital status: Widowed    Spouse name: Not on file  . Number of children: Not on file  . Years of education: Not on file  . Highest education level: Not on file  Occupational History    Employer: RETIRED  Social Needs  . Financial resource strain: Not on file  . Food insecurity:    Worry: Not on file    Inability: Not on file  . Transportation needs:    Medical: Not on file   Non-medical: Not on file  Tobacco Use  . Smoking status: Never Smoker  . Smokeless tobacco: Never Used  Substance and Sexual Activity  . Alcohol use: Never    Frequency: Never  . Drug use: Never  . Sexual activity: Not on file  Lifestyle  . Physical activity:    Days per week: Not on file    Minutes per session: Not on file  . Stress: Not on file  Relationships  . Social connections:    Talks on phone: Not on file    Gets together: Not on file    Attends religious service: Not on file    Active member of club or organization: Not on file    Attends meetings of clubs or organizations: Not on file    Relationship status: Not on file  . Intimate partner violence:    Fear of current or ex partner: Not on file    Emotionally abused: Not on file    Physically abused: Not on file    Forced sexual activity: Not on file  Other Topics Concern  . Not on file  Social History Narrative  . Not on file   History reviewed. No pertinent family history.    VITAL SIGNS BP (!) 148/88   Pulse 100   Temp 98.2 F (36.8 C)   Resp 20   Ht 5\' 1"  (1.549 m)  Wt 119 lb 6.4 oz (54.2 kg)   SpO2 97%   BMI 22.56 kg/m   Outpatient Encounter Medications as of 01/26/2018  Medication Sig  . acetaminophen (TYLENOL) 325 MG tablet Take 650 mg by mouth every 6 (six) hours as needed for mild pain or fever.  Marland Kitchen alum & mag hydroxide-simeth (MAALOX PLUS) 400-400-40 MG/5ML suspension Take 30 mLs by mouth every 4 (four) hours as needed.  Marland Kitchen amLODipine (NORVASC) 2.5 MG tablet Take 2.5 mg by mouth daily.  . bisacodyl (DULCOLAX) 10 MG suppository Place 10 mg rectally daily as needed.  . busPIRone (BUSPAR) 5 MG tablet Take 5 mg by mouth daily.  . cetirizine (ZYRTEC) 10 MG tablet Take 10 mg by mouth daily.  . cholecalciferol (VITAMIN D) 1000 units tablet Take 4,000 Units by mouth daily. Administer 4 tabs Use Floor Stock  . glucosamine-chondroitin 500-400 MG tablet Take 1 tablet by mouth daily.  . Infant Care  Products [facility] EX) Apply liberal amount topically to area of skin irritation prn. OK to leave at bedside  . Insulin Glargine (LANTUS SOLOSTAR) 100 UNIT/ML Solostar Pen Inject 10 Units into the skin at bedtime.  . Lidocaine (ASPERCREME LIDOCAINE) 4 % PTCH Apply 1 patch topically daily. Apply to lower back in the AM and remove in the PM  . magnesium hydroxide (MILK OF MAGNESIA) 400 MG/5ML suspension Take 30 mLs by mouth every 4 (four) hours as needed for mild constipation.  . Multiple Vitamins-Minerals (CENTROVITE) TABS Take 1 tablet by mouth daily.   . Nutritional Supplements (NUTRITIONAL SUPPLEMENT PO) Sugar Free Mighty shakes - Give by mouth three times daily with meals  . ondansetron (ZOFRAN) 4 MG tablet Take 4 mg by mouth every 8 (eight) hours as needed for nausea or vomiting.  . OXYGEN Inhale 3 L into the lungs continuous.  . pantoprazole (PROTONIX) 40 MG tablet Take 40 mg by mouth daily.   Marland Kitchen PARoxetine (PAXIL) 20 MG tablet Take 20 mg by mouth daily.   . polyethylene glycol (MIRALAX / GLYCOLAX) packet Take 17 g by mouth daily.  . simvastatin (ZOCOR) 20 MG tablet Take 20 mg by mouth every evening. for cholesterol  . traMADol (ULTRAM) 50 MG tablet Take by mouth every 6 (six) hours as needed for moderate pain.  Marland Kitchen UNABLE TO FIND Diet Type: Heart healthy, NCS   No facility-administered encounter medications on file as of 01/26/2018.      SIGNIFICANT DIAGNOSTIC EXAMS  LABS REVIEWED: PREVIOUS:   08-25-17: wbc 9.0; hgb 8.3; hct 24.5; mcv 97.8; plt 152; glucose 188; bun 79; creat 2.88; k+ 3.9; na++; 145; ca 8.1 vit B12: 550; folate 61.2; iron 24 tibc 181 ferritin 54  08-27-17: glucose 111; bun 54; creat 1.87; k+ 4.2 na++ 146; ca 8.0 09-01-17: wbc 10.7; hgb 9.9; hct 29.7; mcv 98.8; plt 190;  10-13-17: liver normal albumin 2.9; chol 175; ldl 86; trig 190; hdl 51; mag 2.5; urine microalbumin: 1392.5 11-22-17: wbc 8.0; hgb 10.8; hct 33.0; mcv 97.4; plt 184; hgb a1c 6.5   TODAY;   01-18-18: hgb  a1c 8.0    Review of Systems  Constitutional: Negative for malaise/fatigue.  Respiratory: Negative for cough and shortness of breath.   Cardiovascular: Negative for chest pain, palpitations and leg swelling.  Gastrointestinal: Negative for abdominal pain, constipation and heartburn.  Musculoskeletal: Negative for back pain, joint pain and myalgias.  Skin: Negative.   Neurological: Negative for dizziness.  Psychiatric/Behavioral: The patient is not nervous/anxious.     Physical Exam Constitutional:  General: She is not in acute distress.    Appearance: Normal appearance. She is well-developed. She is not diaphoretic.     Comments: Frail   Neck:     Musculoskeletal: Neck supple.     Thyroid: No thyromegaly.  Cardiovascular:     Rate and Rhythm: Normal rate and regular rhythm.     Pulses: Normal pulses.     Heart sounds: Normal heart sounds.  Pulmonary:     Effort: Pulmonary effort is normal. No respiratory distress.     Breath sounds: Normal breath sounds.  Abdominal:     General: Bowel sounds are normal. There is no distension.     Palpations: Abdomen is soft.     Tenderness: There is no abdominal tenderness.  Musculoskeletal:     Right lower leg: Edema present.     Left lower leg: Edema present.     Comments: Is able to move all extremities Trace bilateral lower extremity edema     Lymphadenopathy:     Cervical: No cervical adenopathy.  Skin:    General: Skin is warm and dry.  Neurological:     Mental Status: She is alert. Mental status is at baseline.  Psychiatric:        Mood and Affect: Mood normal.     ASSESSMENT/PLAN  TODAY:   1. Chronic allergic rhinitis: is stable will continue zyrtec 10 mg daily   2. Depression with anxiety: is stable will continue paxil 20 mg daily and buspar 5 mg daily   3. Chronic anemia: is stable hgb 10.8  Will monitor  4. Bilateral lower extremity edema: is stable will not make changes.   PREVIOUS   5. Dyslipidemia  associated with type 2 diabetes mellitus: is stable LDL 86 will continue zocor 20 mg daily   6. Controlled type 2 diabetes mellitus without complication, with long term current use of insulin: is without change; hgb a1c 8.0  will continue lantus 10 units nightly   7. Gastroesophageal reflux disease without esophagitis: is stable will continue protonix 40 mg daily   8. Chronic constipation: is stable will continue miralax daily   9. Pulmonary fibrosis: is without change is on chronic 02 at 3L/[place]  10. Hypertension associated with type 2 diabetes mellitus: is stable b/p 148/88: will continue norvasc 2.5 mg daily  MD is aware of resident's narcotic use and is in agreement with current plan of care. We will attempt to wean resident as apropriate   Ok Edwards NP [facility]  Contact [phone] Monday through Friday 8am- 5pm  After hours call [phone]

## 2018-01-29 DIAGNOSIS — E1165 Type 2 diabetes mellitus with hyperglycemia: Secondary | ICD-10-CM

## 2018-01-29 DIAGNOSIS — IMO0001 Reserved for inherently not codable concepts without codable children: Secondary | ICD-10-CM | POA: Insufficient documentation

## 2018-01-29 DIAGNOSIS — Z794 Long term (current) use of insulin: Secondary | ICD-10-CM

## 2018-02-28 ENCOUNTER — Encounter: Payer: Self-pay | Admitting: Adult Health

## 2018-02-28 ENCOUNTER — Non-Acute Institutional Stay (SKILLED_NURSING_FACILITY): Payer: Medicare Other | Admitting: Adult Health

## 2018-02-28 DIAGNOSIS — M1812 Unilateral primary osteoarthritis of first carpometacarpal joint, left hand: Secondary | ICD-10-CM | POA: Diagnosis not present

## 2018-02-28 DIAGNOSIS — I10 Essential (primary) hypertension: Secondary | ICD-10-CM | POA: Diagnosis not present

## 2018-02-28 NOTE — Progress Notes (Signed)
[facility]   [facility] Room Number: [place] of Service:  SNF (31)   CODE STATUS: Full Code  Allergies  Allergen Reactions  . Penicillins Other (See Comments) and Rash    Has patient had a PCN reaction causing immediate rash, facial/tongue/throat swelling, SOB or lightheadedness with hypotension: Unknown Has patient had a PCN reaction causing severe rash involving mucus membranes or skin necrosis: Unknown Has patient had a PCN reaction that required hospitalization: Unknown Has patient had a PCN reaction occurring within the last 10 years: Unknown If all of the above answers are "NO", then may proceed with Cephalosporin use.     Chief Complaint  Patient presents with  . Acute Visit    Left thumb and wrist pain    HPI:  She is complaining of left thumb and wrist pain; the area is slightly swollen and red without warmth present. She is able to move her thumb. She has had elevated blood pressure readings with a systolic up to the 509'T. She denies any chest pain and no worsening shortness of breath    Past Medical History:  Diagnosis Date  . Cancer ([place])    skin  . Depression   . Diabetes mellitus type 2, uncomplicated ([place])   . GERD (gastroesophageal reflux disease)   . Hyperlipidemia   . Hypertension   . Osteoarthritis   . Osteoporosis   . Renal disorder     Past Surgical History:  Procedure Laterality Date  . TOTAL HIP ARTHROPLASTY      Social History   Socioeconomic History  . Marital status: Widowed    Spouse name: Not on file  . Number of children: Not on file  . Years of education: Not on file  . Highest education level: Not on file  Occupational History    Employer: RETIRED  Social Needs  . Financial resource strain: Not on file  . Food insecurity:    Worry: Not on file    Inability: Not on file  . Transportation needs:    Medical: Not on file    Non-medical: Not on file  Tobacco Use  . Smoking status: Never  Smoker  . Smokeless tobacco: Never Used  Substance and Sexual Activity  . Alcohol use: Never    Frequency: Never  . Drug use: Never  . Sexual activity: Not on file  Lifestyle  . Physical activity:    Days per week: Not on file    Minutes per session: Not on file  . Stress: Not on file  Relationships  . Social connections:    Talks on phone: Not on file    Gets together: Not on file    Attends religious service: Not on file    Active member of club or organization: Not on file    Attends meetings of clubs or organizations: Not on file    Relationship status: Not on file  . Intimate partner violence:    Fear of current or ex partner: Not on file    Emotionally abused: Not on file    Physically abused: Not on file    Forced sexual activity: Not on file  Other Topics Concern  . Not on file  Social History Narrative  . Not on file   History reviewed. No pertinent family history.    VITAL SIGNS BP (!) 149/86   Pulse 88   Temp 98.2 F (36.8 C)   Resp 18   Ht 5\' 1"  (1.549 m)  Wt 118 lb 14.4 oz (53.9 kg)   SpO2 98%   BMI 22.47 kg/m   Outpatient Encounter Medications as of 02/28/2018  Medication Sig  . acetaminophen (TYLENOL) 325 MG tablet Take 650 mg by mouth every 6 (six) hours as needed for mild pain or fever.  Marland Kitchen alum & mag hydroxide-simeth (MAALOX PLUS) 400-400-40 MG/5ML suspension Take 30 mLs by mouth every 4 (four) hours as needed.  Marland Kitchen amLODipine (NORVASC) 2.5 MG tablet Take 2.5 mg by mouth daily.  . bisacodyl (DULCOLAX) 10 MG suppository Place 10 mg rectally daily as needed.  . busPIRone (BUSPAR) 5 MG tablet Take 5 mg by mouth daily.  . cetirizine (ZYRTEC) 10 MG tablet Take 10 mg by mouth daily.  . cholecalciferol (VITAMIN D) 1000 units tablet Take 4,000 Units by mouth daily. Administer 4 tabs Use Floor Stock  . glucosamine-chondroitin 500-400 MG tablet Take 1 tablet by mouth daily.  . Infant Care Products [facility] EX) Apply liberal amount topically to area of  skin irritation prn. OK to leave at bedside  . Insulin Glargine (LANTUS SOLOSTAR) 100 UNIT/ML Solostar Pen Inject 10 Units into the skin at bedtime.  Marland Kitchen lactulose (CHRONULAC) 10 GM/15ML solution Give 15 ml by mouth twice daily as needed  . Lidocaine (ASPERCREME LIDOCAINE) 4 % PTCH Apply 1 patch topically daily. Apply to lower back in the AM and remove in the PM  . magnesium hydroxide (MILK OF MAGNESIA) 400 MG/5ML suspension Take 30 mLs by mouth every 4 (four) hours as needed for mild constipation.  . Multiple Vitamins-Minerals (CENTROVITE) TABS Take 1 tablet by mouth daily.   . Nutritional Supplements (NUTRITIONAL SUPPLEMENT PO) Sugar Free Mighty shakes - Give by mouth three times daily with meals  . ondansetron (ZOFRAN) 4 MG tablet Take 4 mg by mouth every 8 (eight) hours as needed for nausea or vomiting.  . OXYGEN Inhale 3 L into the lungs continuous.  . pantoprazole (PROTONIX) 40 MG tablet Take 40 mg by mouth daily.   Marland Kitchen PARoxetine (PAXIL) 20 MG tablet Take 20 mg by mouth daily.   . polyethylene glycol (MIRALAX / GLYCOLAX) packet Take 17 g by mouth daily.  . simvastatin (ZOCOR) 20 MG tablet Take 20 mg by mouth every evening. for cholesterol  . traMADol (ULTRAM) 50 MG tablet Take by mouth every 6 (six) hours as needed for moderate pain.  Marland Kitchen UNABLE TO FIND Diet Type: Heart healthy, NCS   No facility-administered encounter medications on file as of 02/28/2018.      SIGNIFICANT DIAGNOSTIC EXAMS  LABS REVIEWED: PREVIOUS:   08-25-17: wbc 9.0; hgb 8.3; hct 24.5; mcv 97.8; plt 152; glucose 188; bun 79; creat 2.88; k+ 3.9; na++; 145; ca 8.1 vit B12: 550; folate 61.2; iron 24 tibc 181 ferritin 54  08-27-17: glucose 111; bun 54; creat 1.87; k+ 4.2 na++ 146; ca 8.0 09-01-17: wbc 10.7; hgb 9.9; hct 29.7; mcv 98.8; plt 190;  10-13-17: liver normal albumin 2.9; chol 175; ldl 86; trig 190; hdl 51; mag 2.5; urine microalbumin: 1392.5 11-22-17: wbc 8.0; hgb 10.8; hct 33.0; mcv 97.4; plt 184; hgb a1c 6.5    01-18-18: hgb a1c 8.0  NO NEW LABS.    Review of Systems  Constitutional: Negative for malaise/fatigue.  Respiratory: Negative for cough and shortness of breath.   Cardiovascular: Negative for chest pain, palpitations and leg swelling.  Gastrointestinal: Negative for abdominal pain, constipation and heartburn.  Musculoskeletal: Positive for joint pain. Negative for back pain and myalgias.  Left thumb and wrist   Skin: Negative.   Neurological: Negative for dizziness.  Psychiatric/Behavioral: The patient is not nervous/anxious.       Physical Exam Constitutional:      General: She is not in acute distress.    Appearance: She is well-developed. She is not diaphoretic.     Comments: Frail   Neck:     Musculoskeletal: Neck supple.     Thyroid: No thyromegaly.  Cardiovascular:     Rate and Rhythm: Normal rate and regular rhythm.     Pulses: Normal pulses.     Heart sounds: Normal heart sounds.  Pulmonary:     Effort: Pulmonary effort is normal. No respiratory distress.     Breath sounds: Normal breath sounds.     Comments: 02 dependent at 3L  Abdominal:     General: Bowel sounds are normal. There is no distension.     Palpations: Abdomen is soft.     Tenderness: There is no abdominal tenderness.  Musculoskeletal:     Right lower leg: No edema.     Left lower leg: No edema.     Comments:  Is able to move all extremities Trace bilateral lower extremity edema    Left thumb is red; slightly swollen tender without redness present   Lymphadenopathy:     Cervical: No cervical adenopathy.  Skin:    General: Skin is warm and dry.  Neurological:     Mental Status: She is alert and oriented to person, place, and time.      ASSESSMENT/PLAN  TODAY:   1. Degenerative arthritis of left thumb: is worse; will begin voltaren gel 2 gm three times daily   2.  Benign essential hypertension: is worse: will increase norvasc to 5 mg daily and will monitor her status.    MD is  aware of resident's narcotic use and is in agreement with current plan of care. We will attempt to wean resident as apropriate   Ok Edwards NP [facility]  Contact [phone] Monday through Friday 8am- 5pm  After hours call [phone]

## 2018-03-03 ENCOUNTER — Non-Acute Institutional Stay (SKILLED_NURSING_FACILITY): Payer: Medicare Other | Admitting: Adult Health

## 2018-03-03 ENCOUNTER — Encounter: Payer: Self-pay | Admitting: Adult Health

## 2018-03-03 DIAGNOSIS — E119 Type 2 diabetes mellitus without complications: Secondary | ICD-10-CM | POA: Diagnosis not present

## 2018-03-03 DIAGNOSIS — K219 Gastro-esophageal reflux disease without esophagitis: Secondary | ICD-10-CM

## 2018-03-03 DIAGNOSIS — E1169 Type 2 diabetes mellitus with other specified complication: Secondary | ICD-10-CM | POA: Diagnosis not present

## 2018-03-03 DIAGNOSIS — E785 Hyperlipidemia, unspecified: Secondary | ICD-10-CM

## 2018-03-03 DIAGNOSIS — K5909 Other constipation: Secondary | ICD-10-CM

## 2018-03-03 NOTE — Progress Notes (Addendum)
[facility]   [facility] Room Number: [place] of Service:  SNF (31)   CODE STATUS: Full Code  Allergies  Allergen Reactions  . Penicillins Other (See Comments) and Rash    Has patient had a PCN reaction causing immediate rash, facial/tongue/throat swelling, SOB or lightheadedness with hypotension: Unknown Has patient had a PCN reaction causing severe rash involving mucus membranes or skin necrosis: Unknown Has patient had a PCN reaction that required hospitalization: Unknown Has patient had a PCN reaction occurring within the last 10 years: Unknown If all of the above answers are "NO", then may proceed with Cephalosporin use.     Chief Complaint  Patient presents with  . Medical Management of Chronic Issues    Dyslipidemia associated with type 2 diabetes mellitus; controlled type 2 diabetes mellitus without complication without long term current use of insulin; gastroesophageal reflux disease without esophagitis; chronic constipation.     HPI:  She is a 85 year old long term resident of this facility being seen for the management of her chronic illnesses: dyslipidemia; diabetes; gerd constipation. She denies any constipation or heart burn. She has left thumb pain states the voltaren gel is helping. She denies any anxiety.   Past Medical History:  Diagnosis Date  . Cancer ([place])    skin  . Depression   . Diabetes mellitus type 2, uncomplicated ([place])   . GERD (gastroesophageal reflux disease)   . Hyperlipidemia   . Hypertension   . Osteoarthritis   . Osteoporosis   . Renal disorder     Past Surgical History:  Procedure Laterality Date  . TOTAL HIP ARTHROPLASTY      Social History   Socioeconomic History  . Marital status: Widowed    Spouse name: Not on file  . Number of children: Not on file  . Years of education: Not on file  . Highest education level: Not on file  Occupational History    Employer: RETIRED  Social Needs  .  Financial resource strain: Not on file  . Food insecurity:    Worry: Not on file    Inability: Not on file  . Transportation needs:    Medical: Not on file    Non-medical: Not on file  Tobacco Use  . Smoking status: Never Smoker  . Smokeless tobacco: Never Used  Substance and Sexual Activity  . Alcohol use: Never    Frequency: Never  . Drug use: Never  . Sexual activity: Not on file  Lifestyle  . Physical activity:    Days per week: Not on file    Minutes per session: Not on file  . Stress: Not on file  Relationships  . Social connections:    Talks on phone: Not on file    Gets together: Not on file    Attends religious service: Not on file    Active member of club or organization: Not on file    Attends meetings of clubs or organizations: Not on file    Relationship status: Not on file  . Intimate partner violence:    Fear of current or ex partner: Not on file    Emotionally abused: Not on file    Physically abused: Not on file    Forced sexual activity: Not on file  Other Topics Concern  . Not on file  Social History Narrative  . Not on file   History reviewed. No pertinent family history.    VITAL SIGNS BP (!) 156/69  Pulse 83   Temp 98.1 F (36.7 C)   Resp 18   Ht 5\' 1"  (1.549 m)   Wt 118 lb 14.4 oz (53.9 kg)   SpO2 95%   BMI 22.47 kg/m   Outpatient Encounter Medications as of 03/03/2018  Medication Sig  . acetaminophen (TYLENOL) 325 MG tablet Take 650 mg by mouth every 6 (six) hours as needed for mild pain or fever.  Marland Kitchen alum & mag hydroxide-simeth (MAALOX PLUS) 400-400-40 MG/5ML suspension Take 30 mLs by mouth every 4 (four) hours as needed.  Marland Kitchen amLODipine (NORVASC) 5 MG tablet Take 5 mg by mouth daily.   . bisacodyl (DULCOLAX) 10 MG suppository Place 10 mg rectally daily as needed.  . busPIRone (BUSPAR) 5 MG tablet Take 5 mg by mouth daily.  . cetirizine (ZYRTEC) 10 MG tablet Take 10 mg by mouth daily.  . cholecalciferol (VITAMIN D) 1000 units tablet  Take 4,000 Units by mouth daily. Administer 4 tabs Use Floor Stock  . diclofenac sodium (VOLTAREN) 1 % GEL Apply 2 g topically 3 (three) times daily.  Marland Kitchen glucosamine-chondroitin 500-400 MG tablet Take 1 tablet by mouth daily.  . Infant Care Products [facility] EX) Apply liberal amount topically to area of skin irritation prn. OK to leave at bedside  . Insulin Glargine (LANTUS SOLOSTAR) 100 UNIT/ML Solostar Pen Inject 10 Units into the skin at bedtime.  Marland Kitchen lactulose (CHRONULAC) 10 GM/15ML solution Give 15 ml by mouth twice daily as needed  . Lidocaine (ASPERCREME LIDOCAINE) 4 % PTCH Apply 1 patch topically daily. Apply to lower back in the AM and remove in the PM  . magnesium hydroxide (MILK OF MAGNESIA) 400 MG/5ML suspension Take 30 mLs by mouth every 4 (four) hours as needed for mild constipation.  . Multiple Vitamins-Minerals (CENTROVITE) TABS Take 1 tablet by mouth daily.   . Nutritional Supplements (NUTRITIONAL SUPPLEMENT PO) Sugar Free Mighty shakes - Give by mouth three times daily with meals  . ondansetron (ZOFRAN) 4 MG tablet Take 4 mg by mouth every 8 (eight) hours as needed for nausea or vomiting.  . OXYGEN Inhale 3 L into the lungs continuous.  . pantoprazole (PROTONIX) 40 MG tablet Take 40 mg by mouth daily.   Marland Kitchen PARoxetine (PAXIL) 20 MG tablet Take 20 mg by mouth daily.   . polyethylene glycol (MIRALAX / GLYCOLAX) packet Take 17 g by mouth daily.  . simvastatin (ZOCOR) 20 MG tablet Take 20 mg by mouth every evening. for cholesterol  . traMADol (ULTRAM) 50 MG tablet Take by mouth every 6 (six) hours as needed for moderate pain.  Marland Kitchen UNABLE TO FIND Diet Type: Heart healthy, NCS   No facility-administered encounter medications on file as of 03/03/2018.      SIGNIFICANT DIAGNOSTIC EXAMS   LABS REVIEWED: PREVIOUS:   08-25-17: wbc 9.0; hgb 8.3; hct 24.5; mcv 97.8; plt 152; glucose 188; bun 79; creat 2.88; k+ 3.9; na++; 145; ca 8.1 vit B12: 550; folate 61.2; iron 24 tibc 181 ferritin 54    08-27-17: glucose 111; bun 54; creat 1.87; k+ 4.2 na++ 146; ca 8.0 09-01-17: wbc 10.7; hgb 9.9; hct 29.7; mcv 98.8; plt 190;  10-13-17: liver normal albumin 2.9; chol 175; ldl 86; trig 190; hdl 51; mag 2.5; urine microalbumin: 1392.5 11-22-17: wbc 8.0; hgb 10.8; hct 33.0; mcv 97.4; plt 184; hgb a1c 6.5  01-18-18: hgb a1c 8.0  NO NEW LABS.    Review of Systems  Constitutional: Negative for malaise/fatigue.  Respiratory: Negative for cough and  shortness of breath.   Cardiovascular: Negative for chest pain, palpitations and leg swelling.  Gastrointestinal: Negative for abdominal pain, constipation and heartburn.  Musculoskeletal: Positive for joint pain. Negative for back pain and myalgias.       Left thumb and wrist pain   Skin: Negative.   Neurological: Negative for dizziness.  Psychiatric/Behavioral: The patient is not nervous/anxious.     Physical Exam Constitutional:      General: She is not in acute distress.    Appearance: She is well-developed. She is not diaphoretic.     Comments: Frail   Neck:     Thyroid: No thyromegaly.  Cardiovascular:     Rate and Rhythm: Normal rate and regular rhythm.     Pulses: Normal pulses.     Heart sounds: Normal heart sounds.  Pulmonary:     Effort: Pulmonary effort is normal. No respiratory distress.     Breath sounds: Normal breath sounds.     Comments: 02 3L dependence  Abdominal:     General: Bowel sounds are normal. There is no distension.     Palpations: Abdomen is soft.     Tenderness: There is no abdominal tenderness.  Musculoskeletal:     Right lower leg: Edema present.     Left lower leg: Edema present.     Comments: Is able to move all extremities Trace bilateral lower extremity edema    Left thumb is red; slightly swollen tender without redness present    Lymphadenopathy:     Cervical: No cervical adenopathy.  Skin:    General: Skin is warm and dry.  Neurological:     Mental Status: She is alert and oriented to person,  place, and time.  Psychiatric:        Mood and Affect: Mood normal.       ASSESSMENT/ PLAN:  TODAY:   1. Dyslipidemia associated with type 2 diabetes mellitus: is stable LDL 86 will continue zocor 20 mg daily   2. Controlled type 2 diabetes mellitus without complication, with long term current use of insulin: is without change; hgb a1c 8.0  will continue lantus 10 units nightly   3. Gastroesophageal reflux disease without esophagitis: is stable will continue protonix 40 mg daily   4. Chronic constipation: is stable will continue miralax daily   PREVIOUS   5. Pulmonary fibrosis: is without change is on chronic 02 at 3L/[place]  6. Chronic allergic rhinitis: is stable will continue zyrtec 10 mg daily   7. Depression with anxiety: is stable will continue paxil 20 mg daily and buspar 5 mg daily   8. Chronic anemia: is stable hgb 10.8  Will monitor  9. Bilateral lower extremity edema: is stable will not make changes.   10. degenerative arthritis of left thumb: is stable will continue voltaren gel three times daily   11. Chronic low back pain: is stable will continue lidocaine patch daily to back.    Will check cbc; cmp; lipids uric acid.    MD is aware of resident's narcotic use and is in agreement with current plan of care. We will attempt to wean resident as apropriate   Ok Edwards NP [facility]  Contact [phone] Monday through Friday 8am- 5pm  After hours call [phone]

## 2018-03-04 DIAGNOSIS — I10 Essential (primary) hypertension: Secondary | ICD-10-CM | POA: Insufficient documentation

## 2018-03-04 DIAGNOSIS — M1812 Unilateral primary osteoarthritis of first carpometacarpal joint, left hand: Secondary | ICD-10-CM | POA: Insufficient documentation

## 2018-03-06 ENCOUNTER — Other Ambulatory Visit
Admission: RE | Admit: 2018-03-06 | Discharge: 2018-03-06 | Disposition: A | Discharge status: Home / Self Care | Payer: Medicare Other | Source: Ambulatory Visit | Attending: Adult Health | Admitting: Adult Health

## 2018-03-06 DIAGNOSIS — I129 Hypertensive chronic kidney disease with stage 1 through stage 4 chronic kidney disease, or unspecified chronic kidney disease: Secondary | ICD-10-CM | POA: Diagnosis present

## 2018-03-06 LAB — COMPREHENSIVE METABOLIC PANEL
ALT: 9 U/L (ref 0–44)
AST: 19 U/L (ref 15–41)
Albumin: 2.4 g/dL — ABNORMAL LOW (ref 3.5–5.0)
Alkaline Phosphatase: 83 U/L (ref 38–126)
Anion gap: 4 — ABNORMAL LOW (ref 5–15)
BILIRUBIN TOTAL: 0.4 mg/dL (ref 0.3–1.2)
BUN: 42 mg/dL — AB (ref 8–23)
CO2: 35 mmol/L — ABNORMAL HIGH (ref 22–32)
CREATININE: 1.6 mg/dL — AB (ref 0.44–1.00)
Calcium: 8.4 mg/dL — ABNORMAL LOW (ref 8.9–10.3)
Chloride: 100 mmol/L (ref 98–111)
GFR calc Af Amer: 33 mL/min — ABNORMAL LOW (ref >60)
GFR, EST NON AFRICAN AMERICAN: 28 mL/min — AB (ref >60)
Glucose, Bld: 111 mg/dL — ABNORMAL HIGH (ref 70–99)
Potassium: 4.3 mmol/L (ref 3.5–5.1)
Sodium: 139 mmol/L (ref 135–145)
TOTAL PROTEIN: 5.2 g/dL — AB (ref 6.5–8.1)

## 2018-03-06 LAB — CBC WITH DIFFERENTIAL/PLATELET
ABS IMMATURE GRANULOCYTES: 0.03 10*3/uL (ref 0.00–0.07)
BASOS ABS: 0.1 10*3/uL (ref 0.0–0.1)
Basophils Relative: 1 %
EOS PCT: 4 %
Eosinophils Absolute: 0.4 10*3/uL (ref 0.0–0.5)
HCT: 33.2 % — ABNORMAL LOW (ref 36.0–46.0)
HEMOGLOBIN: 9.8 g/dL — AB (ref 12.0–15.0)
IMMATURE GRANULOCYTES: 0 %
LYMPHS PCT: 35 %
Lymphs Abs: 2.8 10*3/uL (ref 0.7–4.0)
MCH: 30.2 pg (ref 26.0–34.0)
MCHC: 29.5 g/dL — AB (ref 30.0–36.0)
MCV: 102.5 fL — ABNORMAL HIGH (ref 80.0–100.0)
Monocytes Absolute: 1 10*3/uL (ref 0.1–1.0)
Monocytes Relative: 12 %
NEUTROS ABS: 4 10*3/uL (ref 1.7–7.7)
NEUTROS PCT: 48 %
NRBC: 0 % (ref 0.0–0.2)
Platelets: 179 10*3/uL (ref 150–400)
RBC: 3.24 MIL/uL — ABNORMAL LOW (ref 3.87–5.11)
RDW: 12.2 % (ref 11.5–15.5)
WBC: 8.2 10*3/uL (ref 4.0–10.5)

## 2018-03-06 LAB — LIPID PANEL
CHOLESTEROL: 137 mg/dL (ref 0–200)
HDL: 43 mg/dL (ref >40)
LDL Cholesterol: 56 mg/dL (ref 0–99)
TRIGLYCERIDES: 189 mg/dL — AB (ref <150)
Total CHOL/HDL Ratio: 3.2 RATIO
VLDL: 38 mg/dL (ref 0–40)

## 2018-03-06 LAB — URIC ACID: Uric Acid, Serum: 5.2 mg/dL (ref 2.5–7.1)

## 2018-03-08 ENCOUNTER — Encounter: Payer: Self-pay | Admitting: Adult Health

## 2018-03-08 ENCOUNTER — Non-Acute Institutional Stay (SKILLED_NURSING_FACILITY): Payer: Medicare Other | Admitting: Adult Health

## 2018-03-08 DIAGNOSIS — J8410 Pulmonary fibrosis, unspecified: Secondary | ICD-10-CM | POA: Diagnosis not present

## 2018-03-08 DIAGNOSIS — J438 Other emphysema: Secondary | ICD-10-CM | POA: Diagnosis not present

## 2018-03-08 DIAGNOSIS — E119 Type 2 diabetes mellitus without complications: Secondary | ICD-10-CM

## 2018-03-08 DIAGNOSIS — E43 Unspecified severe protein-calorie malnutrition: Secondary | ICD-10-CM | POA: Diagnosis not present

## 2018-03-08 NOTE — Progress Notes (Signed)
[facility] Room Number: [place] of Service:  SNF (31)   CODE STATUS: Full Code  Allergies  Allergen Reactions  . Penicillins Other (See Comments) and Rash    Has patient had a PCN reaction causing immediate rash, facial/tongue/throat swelling, SOB or lightheadedness with hypotension: Unknown Has patient had a PCN reaction causing severe rash involving mucus membranes or skin necrosis: Unknown Has patient had a PCN reaction that required hospitalization: Unknown Has patient had a PCN reaction occurring within the last 10 years: Unknown If all of the above answers are "NO", then may proceed with Cephalosporin use.     Chief Complaint  Patient presents with  . Acute Visit    Care Plan Meeting    HPI:  We have come together for her routine care plan meeting. Her family is present. Staff reports that she is reporting depression; and feeling bad about herself. There are no reports of falls. Her left thumb and wrist are feeling better. Her appetite is poor she is slowly losing weight from 131 in July 2019 to her current weight of 119 pounds. She is drinking mighty shakes; will have dietary see her for food choices. Her family is requesting therapy evaluation for strengthening.  We have discussed her advanced directives; she is presently a full code; we have discussed CPR; with pros and cons; hospitalizations abt; ivf and feeding tube. I have given her family members a MOST form to fill out and to return back to the facility. They have verbalized understanding.   Past Medical History:  Diagnosis Date  . Cancer ([place])    skin  . Depression   . Diabetes mellitus type 2, uncomplicated ([place])   . GERD (gastroesophageal reflux disease)   . Hyperlipidemia   . Hypertension   . Osteoarthritis   . Osteoporosis   . Renal disorder     Past Surgical History:  Procedure Laterality Date  . TOTAL HIP ARTHROPLASTY      Social History    Socioeconomic History  . Marital status: Widowed    Spouse name: Not on file  . Number of children: Not on file  . Years of education: Not on file  . Highest education level: Not on file  Occupational History    Employer: RETIRED  Social Needs  . Financial resource strain: Not on file  . Food insecurity:    Worry: Not on file    Inability: Not on file  . Transportation needs:    Medical: Not on file    Non-medical: Not on file  Tobacco Use  . Smoking status: Never Smoker  . Smokeless tobacco: Never Used  Substance and Sexual Activity  . Alcohol use: Never    Frequency: Never  . Drug use: Never  . Sexual activity: Not on file  Lifestyle  . Physical activity:    Days per week: Not on file    Minutes per session: Not on file  . Stress: Not on file  Relationships  . Social connections:    Talks on phone: Not on file    Gets together: Not on file    Attends religious service: Not on file    Active member of club or organization: Not on file    Attends meetings of clubs or organizations: Not on file    Relationship status: Not on file  . Intimate partner violence:    Fear of current or ex partner: Not on file  Emotionally abused: Not on file    Physically abused: Not on file    Forced sexual activity: Not on file  Other Topics Concern  . Not on file  Social History Narrative  . Not on file   History reviewed. No pertinent family history.    VITAL SIGNS Pulse 78   Temp 98.1 F (36.7 C)   Resp 16   Ht 5\' 1"  (1.549 m)   Wt 118 lb 14.4 oz (53.9 kg)   SpO2 98%   BMI 22.47 kg/m   Outpatient Encounter Medications as of 03/08/2018  Medication Sig  . acetaminophen (TYLENOL) 325 MG tablet Take 650 mg by mouth every 6 (six) hours as needed for mild pain or fever.  Marland Kitchen alum & mag hydroxide-simeth (MAALOX PLUS) 400-400-40 MG/5ML suspension Take 30 mLs by mouth every 4 (four) hours as needed.  Marland Kitchen amLODipine (NORVASC) 5 MG tablet Take 5 mg by mouth daily.   . bisacodyl  (DULCOLAX) 10 MG suppository Place 10 mg rectally daily as needed.  . busPIRone (BUSPAR) 5 MG tablet Take 5 mg by mouth daily.  . cetirizine (ZYRTEC) 10 MG tablet Take 10 mg by mouth daily.  . cholecalciferol (VITAMIN D) 1000 units tablet Take 4,000 Units by mouth daily. Administer 4 tabs Use Floor Stock  . diclofenac sodium (VOLTAREN) 1 % GEL Apply 2 g topically 3 (three) times daily.  Marland Kitchen glucosamine-chondroitin 500-400 MG tablet Take 1 tablet by mouth daily.  . Infant Care Products [facility] EX) Apply liberal amount topically to area of skin irritation prn. OK to leave at bedside  . Insulin Glargine (LANTUS SOLOSTAR) 100 UNIT/ML Solostar Pen Inject 10 Units into the skin at bedtime.  Marland Kitchen lactulose (CHRONULAC) 10 GM/15ML solution Give 15 ml by mouth twice daily as needed  . Lidocaine (ASPERCREME LIDOCAINE) 4 % PTCH Apply 1 patch topically daily. Apply to lower back in the AM and remove in the PM  . magnesium hydroxide (MILK OF MAGNESIA) 400 MG/5ML suspension Take 30 mLs by mouth every 4 (four) hours as needed for mild constipation.  . Multiple Vitamins-Minerals (CENTROVITE) TABS Take 1 tablet by mouth daily.   . Nutritional Supplements (NUTRITIONAL SUPPLEMENT PO) Sugar Free Mighty shakes - Give by mouth three times daily with meals  . ondansetron (ZOFRAN) 4 MG tablet Take 4 mg by mouth every 8 (eight) hours as needed for nausea or vomiting.  . OXYGEN Inhale 3 L into the lungs continuous.  . pantoprazole (PROTONIX) 40 MG tablet Take 40 mg by mouth daily.   Marland Kitchen PARoxetine (PAXIL) 20 MG tablet Take 20 mg by mouth daily.   . polyethylene glycol (MIRALAX / GLYCOLAX) packet Take 17 g by mouth daily.  . simvastatin (ZOCOR) 20 MG tablet Take 20 mg by mouth every evening. for cholesterol  . traMADol (ULTRAM) 50 MG tablet Take by mouth every 6 (six) hours as needed for moderate pain.  Marland Kitchen UNABLE TO FIND Diet Type: Heart healthy, NCS   No facility-administered encounter medications on file as of 03/08/2018.        SIGNIFICANT DIAGNSTIC EXAMS  LABS REVIEWED: PREVIOUS:   08-25-17: wbc 9.0; hgb 8.3; hct 24.5; mcv 97.8; plt 152; glucose 188; bun 79; creat 2.88; k+ 3.9; na++; 145; ca 8.1 vit B12: 550; folate 61.2; iron 24 tibc 181 ferritin 54  08-27-17: glucose 111; bun 54; creat 1.87; k+ 4.2 na++ 146; ca 8.0 09-01-17: wbc 10.7; hgb 9.9; hct 29.7; mcv 98.8; plt 190;  10-13-17: liver normal albumin 2.9; chol  175; ldl 86; trig 190; hdl 51; mag 2.5; urine microalbumin: 1392.5 11-22-17: wbc 8.0; hgb 10.8; hct 33.0; mcv 97.4; plt 184; hgb a1c 6.5  01-18-18: hgb a1c 8.0  TODAY.  03-06-18: wbc  8.2; hgb 9.8; hct 33.2; mcv 102.5; plt 179; glucose 111; bun 42; creat 1.60; k+ 4.3; na++ 139; ca 8.4 liver normal albumin 2.4 chol 137; ldl 56; trig 189; hdl 43; uric acid 5.2   Review of Systems  Constitutional: Negative for malaise/fatigue.  Respiratory: Negative for cough and shortness of breath.   Cardiovascular: Negative for chest pain, palpitations and leg swelling.  Gastrointestinal: Negative for abdominal pain, constipation and heartburn.  Musculoskeletal: Negative for back pain, joint pain and myalgias.  Skin: Negative.   Neurological: Negative for dizziness.  Psychiatric/Behavioral: The patient is not nervous/anxious.    Physical Exam Constitutional:      General: She is not in acute distress.    Appearance: She is well-developed. She is not diaphoretic.     Comments: Frail   Neck:     Musculoskeletal: Neck supple.     Thyroid: No thyromegaly.  Cardiovascular:     Rate and Rhythm: Normal rate and regular rhythm.     Pulses: Normal pulses.     Heart sounds: Normal heart sounds.  Pulmonary:     Effort: Pulmonary effort is normal. No respiratory distress.     Breath sounds: Normal breath sounds.     Comments: Dependent on 02 at 3L Abdominal:     General: Bowel sounds are normal. There is no distension.     Palpations: Abdomen is soft.     Tenderness: There is no abdominal tenderness.   Musculoskeletal:     Right lower leg: Edema present.     Left lower leg: Edema present.     Comments: Is able to move all extremities Trace bilateral lower extremity edema     Lymphadenopathy:     Cervical: No cervical adenopathy.  Skin:    General: Skin is warm and dry.  Neurological:     Mental Status: She is alert. Mental status is at baseline.  Psychiatric:        Mood and Affect: Mood normal.      ASSESSMENT/ PLAN:  TODAY:   1. Diabetes 2. Protein calorie malnutrition 3. COPD/pulmonary fibrosis    Will continue her current medications Will have OT/PT evaluate and treat as indicated for strengthening MOST form given family will fill out and return.    Time spent with patient 40 minutes: (25 minutes spent with advanced directives); discussed medications; goals of care; verbalized understanding.    MD is aware of resident's narcotic use and is in agreement with current plan of care. We will attempt to wean resident as apropriate   Ok Edwards NP [facility]  Contact [phone] Monday through Friday 8am- 5pm  After hours call [phone]

## 2018-03-13 ENCOUNTER — Encounter: Payer: Self-pay | Admitting: Adult Health

## 2018-03-13 NOTE — Progress Notes (Signed)
[facility]   [facility] Room Number: [place] of Service:  SNF (31)   CODE STATUS: Full Code  Allergies  Allergen Reactions  . Penicillins Other (See Comments) and Rash    Has patient had a PCN reaction causing immediate rash, facial/tongue/throat swelling, SOB or lightheadedness with hypotension: Unknown Has patient had a PCN reaction causing severe rash involving mucus membranes or skin necrosis: Unknown Has patient had a PCN reaction that required hospitalization: Unknown Has patient had a PCN reaction occurring within the last 10 years: Unknown If all of the above answers are "NO", then may proceed with Cephalosporin use.     Chief Complaint  Patient presents with  . Acute Visit    Hypertension    HPI:    Past Medical History:  Diagnosis Date  . Cancer ([place])    skin  . Depression   . Diabetes mellitus type 2, uncomplicated ([place])   . GERD (gastroesophageal reflux disease)   . Hyperlipidemia   . Hypertension   . Osteoarthritis   . Osteoporosis   . Renal disorder     Past Surgical History:  Procedure Laterality Date  . TOTAL HIP ARTHROPLASTY      Social History   Socioeconomic History  . Marital status: Widowed    Spouse name: Not on file  . Number of children: Not on file  . Years of education: Not on file  . Highest education level: Not on file  Occupational History    Employer: RETIRED  Social Needs  . Financial resource strain: Not on file  . Food insecurity:    Worry: Not on file    Inability: Not on file  . Transportation needs:    Medical: Not on file    Non-medical: Not on file  Tobacco Use  . Smoking status: Never Smoker  . Smokeless tobacco: Never Used  Substance and Sexual Activity  . Alcohol use: Never    Frequency: Never  . Drug use: Never  . Sexual activity: Not on file  Lifestyle  . Physical activity:    Days per week: Not on file    Minutes per session: Not on file  . Stress: Not on file    Relationships  . Social connections:    Talks on phone: Not on file    Gets together: Not on file    Attends religious service: Not on file    Active member of club or organization: Not on file    Attends meetings of clubs or organizations: Not on file    Relationship status: Not on file  . Intimate partner violence:    Fear of current or ex partner: Not on file    Emotionally abused: Not on file    Physically abused: Not on file    Forced sexual activity: Not on file  Other Topics Concern  . Not on file  Social History Narrative  . Not on file   History reviewed. No pertinent family history.    VITAL SIGNS BP (!) 171/73   Pulse 86   Temp 97.7 F (36.5 C)   Resp 16   Ht 5\' 1"  (1.549 m)   Wt 118 lb 14.4 oz (53.9 kg)   SpO2 98%   BMI 22.47 kg/m   Outpatient Encounter Medications as of 03/13/2018  Medication Sig  . acetaminophen (TYLENOL) 325 MG tablet Take 650 mg by mouth every 6 (six) hours as needed for mild pain or fever.  Marland Kitchen alum &  mag hydroxide-simeth (MAALOX PLUS) 400-400-40 MG/5ML suspension Take 30 mLs by mouth every 4 (four) hours as needed.  Marland Kitchen amLODipine (NORVASC) 5 MG tablet Take 5 mg by mouth daily.   . bisacodyl (DULCOLAX) 10 MG suppository Place 10 mg rectally daily as needed.  . busPIRone (BUSPAR) 5 MG tablet Take 5 mg by mouth daily.  . cetirizine (ZYRTEC) 10 MG tablet Take 10 mg by mouth daily.  . cholecalciferol (VITAMIN D) 1000 units tablet Take 4,000 Units by mouth daily. Administer 4 tabs Use Floor Stock  . diclofenac sodium (VOLTAREN) 1 % GEL Apply 2 g topically 3 (three) times daily.  Marland Kitchen glucosamine-chondroitin 500-400 MG tablet Take 1 tablet by mouth daily.  . Infant Care Products [facility] EX) Apply liberal amount topically to area of skin irritation prn. OK to leave at bedside  . Insulin Glargine (LANTUS SOLOSTAR) 100 UNIT/ML Solostar Pen Inject 10 Units into the skin at bedtime.  Marland Kitchen lactulose (CHRONULAC) 10 GM/15ML solution Give 15 ml by mouth  twice daily as needed  . Lidocaine (ASPERCREME LIDOCAINE) 4 % PTCH Apply 1 patch topically daily. Apply to lower back in the AM and remove in the PM  . magnesium hydroxide (MILK OF MAGNESIA) 400 MG/5ML suspension Take 30 mLs by mouth every 4 (four) hours as needed for mild constipation.  . Multiple Vitamins-Minerals (CENTROVITE) TABS Take 1 tablet by mouth daily.   . Nutritional Supplements (NUTRITIONAL SUPPLEMENT PO) Sugar Free Mighty shakes - Give by mouth three times daily with meals  . ondansetron (ZOFRAN) 4 MG tablet Take 4 mg by mouth every 8 (eight) hours as needed for nausea or vomiting.  . OXYGEN Inhale 3 L into the lungs continuous.  . pantoprazole (PROTONIX) 40 MG tablet Take 40 mg by mouth daily.   Marland Kitchen PARoxetine (PAXIL) 20 MG tablet Take 20 mg by mouth daily.   . polyethylene glycol (MIRALAX / GLYCOLAX) packet Take 17 g by mouth daily.  . simvastatin (ZOCOR) 20 MG tablet Take 20 mg by mouth every evening. for cholesterol  . traMADol (ULTRAM) 50 MG tablet Take by mouth every 6 (six) hours as needed for moderate pain.  Marland Kitchen UNABLE TO FIND Diet Type: Heart healthy, NCS   No facility-administered encounter medications on file as of 03/13/2018.      SIGNIFICANT DIAGNOSTIC EXAMS       ASSESSMENT/ PLAN:    MD is aware of resident's narcotic use and is in agreement with current plan of care. We will attempt to wean resident as apropriate   Ok Edwards NP [facility]  Contact [phone] Monday through Friday 8am- 5pm  After hours call [phone]  This encounter was created in error - please disregard.

## 2018-03-14 DIAGNOSIS — J439 Emphysema, unspecified: Secondary | ICD-10-CM | POA: Insufficient documentation

## 2018-03-14 DIAGNOSIS — J8410 Pulmonary fibrosis, unspecified: Secondary | ICD-10-CM | POA: Insufficient documentation

## 2018-03-14 DIAGNOSIS — E43 Unspecified severe protein-calorie malnutrition: Secondary | ICD-10-CM | POA: Insufficient documentation

## 2018-03-15 ENCOUNTER — Other Ambulatory Visit: Payer: Self-pay | Admitting: Adult Health

## 2018-03-15 MED ORDER — TRAMADOL HCL 50 MG PO TABS: 50.0000 mg | ORAL_TABLET | Freq: Four times a day (QID) | ORAL | 0 refills | Status: DC | PRN

## 2018-03-17 ENCOUNTER — Other Ambulatory Visit: Payer: Self-pay | Admitting: Internal Medicine

## 2018-03-17 DIAGNOSIS — R1312 Dysphagia, oropharyngeal phase: Secondary | ICD-10-CM

## 2018-03-18 ENCOUNTER — Encounter
Admission: RE | Admit: 2018-03-18 | Discharge: 2018-03-18 | Disposition: A | Discharge status: Home / Self Care | Payer: Medicare Other | Source: Ambulatory Visit | Attending: Internal Medicine | Admitting: Internal Medicine

## 2018-03-20 ENCOUNTER — Encounter: Payer: Self-pay | Admitting: Adult Health

## 2018-03-20 ENCOUNTER — Non-Acute Institutional Stay (SKILLED_NURSING_FACILITY): Payer: Medicare Other | Admitting: Adult Health

## 2018-03-20 DIAGNOSIS — J8410 Pulmonary fibrosis, unspecified: Secondary | ICD-10-CM

## 2018-03-20 DIAGNOSIS — E43 Unspecified severe protein-calorie malnutrition: Secondary | ICD-10-CM | POA: Diagnosis not present

## 2018-03-20 DIAGNOSIS — J438 Other emphysema: Secondary | ICD-10-CM

## 2018-03-20 NOTE — Progress Notes (Addendum)
[facility]   [facility] Room Number: [place] of Service:  SNF (31)   CODE STATUS: Full Code  Allergies  Allergen Reactions  . Penicillins Other (See Comments) and Rash    Has patient had a PCN reaction causing immediate rash, facial/tongue/throat swelling, SOB or lightheadedness with hypotension: Unknown Has patient had a PCN reaction causing severe rash involving mucus membranes or skin necrosis: Unknown Has patient had a PCN reaction that required hospitalization: Unknown Has patient had a PCN reaction occurring within the last 10 years: Unknown If all of the above answers are "NO", then may proceed with Cephalosporin use.     Chief Complaint  Patient presents with  . Acute Visit    Care Plan Meeting    HPI:  We have come together for a care plan meeting with her son to discuss her advanced directives. We discussed the MOST form: we discussed CPR pros and cons; hospitalizations; abt; ivf; and peg tube feedings. She does not want to be kept on life support. We have decided to make her DNR; limited hospitalizations; no feeding tube. She does understand that she will continue to receive medical treatment. Her son has verbalized understanding as well.    Past Medical History:  Diagnosis Date  . Cancer ([place])    skin  . Depression   . Diabetes mellitus type 2, uncomplicated ([place])   . GERD (gastroesophageal reflux disease)   . Hyperlipidemia   . Hypertension   . Osteoarthritis   . Osteoporosis   . Renal disorder     Past Surgical History:  Procedure Laterality Date  . TOTAL HIP ARTHROPLASTY      Social History   Socioeconomic History  . Marital status: Widowed    Spouse name: Not on file  . Number of children: Not on file  . Years of education: Not on file  . Highest education level: Not on file  Occupational History    Employer: RETIRED  Social Needs  . Financial resource strain: Not on file  . Food insecurity:    Worry: Not on  file    Inability: Not on file  . Transportation needs:    Medical: Not on file    Non-medical: Not on file  Tobacco Use  . Smoking status: Never Smoker  . Smokeless tobacco: Never Used  Substance and Sexual Activity  . Alcohol use: Never    Frequency: Never  . Drug use: Never  . Sexual activity: Not on file  Lifestyle  . Physical activity:    Days per week: Not on file    Minutes per session: Not on file  . Stress: Not on file  Relationships  . Social connections:    Talks on phone: Not on file    Gets together: Not on file    Attends religious service: Not on file    Active member of club or organization: Not on file    Attends meetings of clubs or organizations: Not on file    Relationship status: Not on file  . Intimate partner violence:    Fear of current or ex partner: Not on file    Emotionally abused: Not on file    Physically abused: Not on file    Forced sexual activity: Not on file  Other Topics Concern  . Not on file  Social History Narrative  . Not on file   History reviewed. No pertinent family history.    VITAL SIGNS BP (!) 155/70  Pulse 86   Temp (!) 97.2 F (36.2 C)   Resp 18   Ht 5\' 1"  (1.549 m)   Wt 118 lb 14.4 oz (53.9 kg)   SpO2 98%   BMI 22.47 kg/m   Outpatient Encounter Medications as of 03/20/2018  Medication Sig  . acetaminophen (TYLENOL) 325 MG tablet Take 650 mg by mouth every 6 (six) hours as needed for mild pain or fever.  Marland Kitchen alum & mag hydroxide-simeth (MAALOX PLUS) 400-400-40 MG/5ML suspension Take 30 mLs by mouth every 4 (four) hours as needed.  Marland Kitchen amLODipine (NORVASC) 5 MG tablet Take 5 mg by mouth daily.   . bisacodyl (DULCOLAX) 10 MG suppository Place 10 mg rectally daily as needed.  . busPIRone (BUSPAR) 5 MG tablet Take 5 mg by mouth daily.  . cetirizine (ZYRTEC) 10 MG tablet Take 10 mg by mouth daily.  . cholecalciferol (VITAMIN D) 1000 units tablet Take 4,000 Units by mouth daily. Administer 4 tabs Use Floor Stock  .  diclofenac sodium (VOLTAREN) 1 % GEL Apply 2 g topically 3 (three) times daily.  Marland Kitchen glucosamine-chondroitin 500-400 MG tablet Take 1 tablet by mouth daily.  . Infant Care Products [facility] EX) Apply liberal amount topically to area of skin irritation prn. OK to leave at bedside  . Insulin Glargine (LANTUS SOLOSTAR) 100 UNIT/ML Solostar Pen Inject 10 Units into the skin at bedtime.  Marland Kitchen lactulose (CHRONULAC) 10 GM/15ML solution Give 15 ml by mouth twice daily as needed  . Lidocaine (ASPERCREME LIDOCAINE) 4 % PTCH Apply 1 patch topically daily. Apply to lower back in the AM and remove in the PM  . magnesium hydroxide (MILK OF MAGNESIA) 400 MG/5ML suspension Take 30 mLs by mouth every 4 (four) hours as needed for mild constipation.  . Multiple Vitamins-Minerals (CENTROVITE) TABS Take 1 tablet by mouth daily.   . Nutritional Supplements (NUTRITIONAL SUPPLEMENT PO) Sugar Free Mighty shakes - Give by mouth three times daily with meals  . ondansetron (ZOFRAN) 4 MG tablet Take 4 mg by mouth every 8 (eight) hours as needed for nausea or vomiting.  . OXYGEN Inhale 3 L into the lungs continuous.  . pantoprazole (PROTONIX) 40 MG tablet Take 40 mg by mouth daily.   Marland Kitchen PARoxetine (PAXIL) 20 MG tablet Take 20 mg by mouth daily.   . polyethylene glycol (MIRALAX / GLYCOLAX) packet Take 17 g by mouth daily.  . simvastatin (ZOCOR) 20 MG tablet Take 20 mg by mouth every evening. for cholesterol  . traMADol (ULTRAM) 50 MG tablet Take 1 tablet (50 mg total) by mouth every 6 (six) hours as needed for up to 30 days for moderate pain.  Marland Kitchen UNABLE TO FIND Diet Type: Downgrade patient to dysphagia 2 (ground) consistencies, continue thin liquids   No facility-administered encounter medications on file as of 03/20/2018.      SIGNIFICANT DIAGNOSTIC EXAMS  LABS REVIEWED: PREVIOUS:   08-25-17: wbc 9.0; hgb 8.3; hct 24.5; mcv 97.8; plt 152; glucose 188; bun 79; creat 2.88; k+ 3.9; na++; 145; ca 8.1 vit B12: 550; folate 61.2;  iron 24 tibc 181 ferritin 54  08-27-17: glucose 111; bun 54; creat 1.87; k+ 4.2 na++ 146; ca 8.0 09-01-17: wbc 10.7; hgb 9.9; hct 29.7; mcv 98.8; plt 190;  10-13-17: liver normal albumin 2.9; chol 175; ldl 86; trig 190; hdl 51; mag 2.5; urine microalbumin: 1392.5 11-22-17: wbc 8.0; hgb 10.8; hct 33.0; mcv 97.4; plt 184; hgb a1c 6.5  01-18-18: hgb a1c 8.0 03-06-18: wbc  8.2; hgb  9.8; hct 33.2; mcv 102.5; plt 179; glucose 111; bun 42; creat 1.60; k+ 4.3; na++ 139; ca 8.4 liver normal albumin 2.4 chol 137; ldl 56; trig 189; hdl 43; uric acid 5.2  NO NEW LABS.    Review of Systems  Constitutional: Negative for malaise/fatigue.  Respiratory: Negative for cough and shortness of breath.   Cardiovascular: Negative for chest pain, palpitations and leg swelling.  Gastrointestinal: Negative for abdominal pain, constipation and heartburn.  Musculoskeletal: Negative for back pain, joint pain and myalgias.  Skin: Negative.   Neurological: Negative for dizziness.  Psychiatric/Behavioral: The patient is not nervous/anxious.    Physical Exam Constitutional:      General: She is not in acute distress.    Appearance: She is well-developed. She is not diaphoretic.     Comments: Frail   Neck:     Musculoskeletal: Neck supple.     Thyroid: No thyromegaly.  Cardiovascular:     Rate and Rhythm: Normal rate and regular rhythm.     Pulses: Normal pulses.     Heart sounds: Normal heart sounds.  Pulmonary:     Effort: Pulmonary effort is normal. No respiratory distress.     Breath sounds: Normal breath sounds.     Comments: Dependent on 02 at 3L Abdominal:     General: Bowel sounds are normal. There is no distension.     Palpations: Abdomen is soft.     Tenderness: There is no abdominal tenderness.  Musculoskeletal:     Right lower leg: Edema present.     Left lower leg: Edema present.     Comments: Is able to move all extremities Trace bilateral lower extremity edema   Kyphosis   Lymphadenopathy:      Cervical: No cervical adenopathy.  Skin:    General: Skin is warm and dry.  Neurological:     Mental Status: She is alert and oriented to person, place, and time.        ASSESSMENT/ PLAN:  TODAY:   1. Emphysema/copd 2. Pulmonary fibrosis  3. Protein calorie malnutrition severe  Is: DNR limited hospitalization; ok for abt and IVF; NO TUBE feeding  Will stop ultram due to nonuse   Do to her low cbg will lower her lantus to 8 units nightly   Time spent with patient and family: 45 minutes discussed and filled out MOST form verbalized understanding.      MD is aware of resident's narcotic use and is in agreement with current plan of care. We will attempt to wean resident as apropriate   Ok Edwards NP [facility]  Contact [phone] Monday through Friday 8am- 5pm  After hours call [phone]

## 2018-03-27 ENCOUNTER — Encounter: Payer: Self-pay | Admitting: Adult Health

## 2018-03-27 NOTE — Progress Notes (Signed)
Entered in error

## 2018-03-28 ENCOUNTER — Encounter: Payer: Self-pay | Admitting: Adult Health

## 2018-03-28 ENCOUNTER — Non-Acute Institutional Stay (SKILLED_NURSING_FACILITY): Payer: Medicare Other | Admitting: Adult Health

## 2018-03-28 DIAGNOSIS — E1165 Type 2 diabetes mellitus with hyperglycemia: Secondary | ICD-10-CM

## 2018-03-28 DIAGNOSIS — J438 Other emphysema: Secondary | ICD-10-CM | POA: Diagnosis not present

## 2018-03-28 DIAGNOSIS — IMO0001 Reserved for inherently not codable concepts without codable children: Secondary | ICD-10-CM

## 2018-03-28 DIAGNOSIS — Z794 Long term (current) use of insulin: Secondary | ICD-10-CM | POA: Diagnosis not present

## 2018-03-28 DIAGNOSIS — J8410 Pulmonary fibrosis, unspecified: Secondary | ICD-10-CM

## 2018-03-28 NOTE — Progress Notes (Signed)
Location:   [facility] Room Number: [place] of Service:  SNF (31)    CODE STATUS: DNR  Allergies  Allergen Reactions  . Penicillins Other (See Comments) and Rash    Has patient had a PCN reaction causing immediate rash, facial/tongue/throat swelling, SOB or lightheadedness with hypotension: Unknown Has patient had a PCN reaction causing severe rash involving mucus membranes or skin necrosis: Unknown Has patient had a PCN reaction that required hospitalization: Unknown Has patient had a PCN reaction occurring within the last 10 years: Unknown If all of the above answers are "NO", then may proceed with Cephalosporin use.     Chief Complaint  Patient presents with  . Discharge Note    Discharging to [facility] on 03/30/2018    HPI:  She is being discharged to [facility] as this facility is closing. She will not need dme; home health or medications to be written as these will be provided by the receiving facility. She will follow up medically with the provider at the receiving facility.    Past Medical History:  Diagnosis Date  . Cancer ([place])    skin  . Depression   . Diabetes mellitus type 2, uncomplicated ([place])   . GERD (gastroesophageal reflux disease)   . Hyperlipidemia   . Hypertension   . Osteoarthritis   . Osteoporosis   . Renal disorder     Past Surgical History:  Procedure Laterality Date  . TOTAL HIP ARTHROPLASTY      Social History   Socioeconomic History  . Marital status: Widowed    Spouse name: Not on file  . Number of children: Not on file  . Years of education: Not on file  . Highest education level: Not on file  Occupational History    Employer: RETIRED  Social Needs  . Financial resource strain: Not on file  . Food insecurity:    Worry: Not on file    Inability: Not on file  . Transportation needs:    Medical: Not on file    Non-medical: Not on file  Tobacco Use  . Smoking status: Never  Smoker  . Smokeless tobacco: Never Used  Substance and Sexual Activity  . Alcohol use: Never    Frequency: Never  . Drug use: Never  . Sexual activity: Not on file  Lifestyle  . Physical activity:    Days per week: Not on file    Minutes per session: Not on file  . Stress: Not on file  Relationships  . Social connections:    Talks on phone: Not on file    Gets together: Not on file    Attends religious service: Not on file    Active member of club or organization: Not on file    Attends meetings of clubs or organizations: Not on file    Relationship status: Not on file  . Intimate partner violence:    Fear of current or ex partner: Not on file    Emotionally abused: Not on file    Physically abused: Not on file    Forced sexual activity: Not on file  Other Topics Concern  . Not on file  Social History Narrative  . Not on file   History reviewed. No pertinent family history.  VITAL SIGNS BP (!) 157/70   Pulse 84   Temp 98.2 F (36.8 C)   Resp 18   Ht 5\' 1"  (1.549 m)   Wt 118 lb 14.4  oz (53.9 kg)   SpO2 99%   BMI 22.47 kg/m   Patient's Medications  New Prescriptions   No medications on file  Previous Medications   ACETAMINOPHEN (TYLENOL) 325 MG TABLET    Take 650 mg by mouth every 6 (six) hours as needed for mild pain or fever.   ALUM & MAG HYDROXIDE-SIMETH (MAALOX PLUS) 400-400-40 MG/5ML SUSPENSION    Take 30 mLs by mouth every 4 (four) hours as needed.   AMLODIPINE (NORVASC) 5 MG TABLET    Take 5 mg by mouth daily.    BISACODYL (DULCOLAX) 10 MG SUPPOSITORY    Place 10 mg rectally daily as needed.   BUSPIRONE (BUSPAR) 5 MG TABLET    Take 5 mg by mouth daily.   CETIRIZINE (ZYRTEC) 10 MG TABLET    Take 10 mg by mouth daily.   CHOLECALCIFEROL (VITAMIN D) 1000 UNITS TABLET    Take 4,000 Units by mouth daily. Administer 4 tabs Use Floor Stock   DICLOFENAC SODIUM (VOLTAREN) 1 % GEL    Apply 2 g topically 3 (three) times daily.   GLUCOSAMINE-CHONDROITIN 500-400 MG  TABLET    Take 1 tablet by mouth daily.   INFANT CARE PRODUCTS (DERMACLOUD EX)    Apply liberal amount topically to area of skin irritation prn. OK to leave at bedside   INSULIN GLARGINE (LANTUS SOLOSTAR) 100 UNIT/ML SOLOSTAR PEN    Inject 8 Units into the skin at bedtime.    LACTULOSE (CHRONULAC) 10 GM/15ML SOLUTION    Give 15 ml by mouth twice daily as needed   LIDOCAINE (ASPERCREME LIDOCAINE) 4 % PTCH    Apply 1 patch topically daily. Apply to lower back in the AM and remove in the PM   MAGNESIUM HYDROXIDE (MILK OF MAGNESIA) 400 MG/5ML SUSPENSION    Take 30 mLs by mouth every 4 (four) hours as needed for mild constipation.   MULTIPLE VITAMINS-MINERALS (CENTROVITE) TABS    Take 1 tablet by mouth daily.    NUTRITIONAL SUPPLEMENTS (NUTRITIONAL SUPPLEMENT PO)    Sugar Free Mighty shakes - Give by mouth three times daily with meals   ONDANSETRON (ZOFRAN) 4 MG TABLET    Take 4 mg by mouth every 8 (eight) hours as needed for nausea or vomiting.   OXYGEN    Inhale 3 L into the lungs continuous.   PANTOPRAZOLE (PROTONIX) 40 MG TABLET    Take 40 mg by mouth daily.    PAROXETINE (PAXIL) 20 MG TABLET    Take 20 mg by mouth daily.    POLYETHYLENE GLYCOL (MIRALAX / GLYCOLAX) PACKET    Take 17 g by mouth daily.   SIMVASTATIN (ZOCOR) 20 MG TABLET    Take 20 mg by mouth every evening. for cholesterol   UNABLE TO FIND    Diet Type: Mechanical soft ground meat, NCS, NAS  Modified Medications   No medications on file  Discontinued Medications   No medications on file     SIGNIFICANT DIAGNOSTIC EXAMS   LABS REVIEWED: PREVIOUS:   08-25-17: wbc 9.0; hgb 8.3; hct 24.5; mcv 97.8; plt 152; glucose 188; bun 79; creat 2.88; k+ 3.9; na++; 145; ca 8.1 vit B12: 550; folate 61.2; iron 24 tibc 181 ferritin 54  08-27-17: glucose 111; bun 54; creat 1.87; k+ 4.2 na++ 146; ca 8.0 09-01-17: wbc 10.7; hgb 9.9; hct 29.7; mcv 98.8; plt 190;  10-13-17: liver normal albumin 2.9; chol 175; ldl 86; trig 190; hdl 51; mag 2.5; urine  microalbumin: 1392.5 11-22-17: wbc  8.0; hgb 10.8; hct 33.0; mcv 97.4; plt 184; hgb a1c 6.5  01-18-18: hgb a1c 8.0 03-06-18: wbc  8.2; hgb 9.8; hct 33.2; mcv 102.5; plt 179; glucose 111; bun 42; creat 1.60; k+ 4.3; na++ 139; ca 8.4 liver normal albumin 2.4 chol 137; ldl 56; trig 189; hdl 43; uric acid 5.2  NO NEW LABS.    Review of Systems  Constitutional: Negative for malaise/fatigue.  Respiratory: Negative for cough and shortness of breath.   Cardiovascular: Negative for chest pain, palpitations and leg swelling.  Gastrointestinal: Negative for abdominal pain, constipation and heartburn.  Musculoskeletal: Negative for back pain, joint pain and myalgias.  Skin: Negative.   Neurological: Negative for dizziness.  Psychiatric/Behavioral: The patient is not nervous/anxious.     Physical Exam Constitutional:      General: She is not in acute distress.    Appearance: She is well-developed. She is not diaphoretic.     Comments: Frail   Neck:     Musculoskeletal: Neck supple.     Thyroid: No thyromegaly.  Cardiovascular:     Rate and Rhythm: Normal rate and regular rhythm.     Pulses: Normal pulses.     Heart sounds: Normal heart sounds.  Pulmonary:     Effort: Pulmonary effort is normal. No respiratory distress.     Breath sounds: Normal breath sounds.     Comments: 02 dependent 3 L Abdominal:     General: Bowel sounds are normal. There is no distension.     Palpations: Abdomen is soft.     Tenderness: There is no abdominal tenderness.  Musculoskeletal:     Right lower leg: Edema present.     Left lower leg: Edema present.     Comments:  Is able to move all extremities Trace bilateral lower extremity edema   Kyphosis    Lymphadenopathy:     Cervical: No cervical adenopathy.  Skin:    General: Skin is warm and dry.  Neurological:     Mental Status: She is alert and oriented to person, place, and time.  Psychiatric:        Mood and Affect: Mood normal.      ASSESSMENT/  PLAN:   Patient is being discharged with the following home health services: none needed    Patient is being discharged with the following durable medical equipment: none needed    Patient has been advised to f/u with their PCP in 1-2 weeks to bring them up to date on their rehab stay.  Social services at facility was responsible for arranging this appointment.  Pt was provided with a 30 day supply of prescriptions for medications and refills must be obtained from their PCP.  For controlled substances, a more limited supply may be provided adequate until PCP appointment only.  Medications will be provided by the receiving facility    Ok Edwards NP [facility]  Contact [phone] Monday through Friday 8am- 5pm  After hours call [phone]

## 2018-04-14 ENCOUNTER — Ambulatory Visit
Admission: RE | Admit: 2018-04-14 | Discharge: 2018-04-14 | Disposition: A | Discharge status: Home / Self Care | Payer: Medicare Other | Source: Ambulatory Visit | Attending: Internal Medicine | Admitting: Internal Medicine

## 2018-04-14 DIAGNOSIS — R1312 Dysphagia, oropharyngeal phase: Secondary | ICD-10-CM

## 2018-04-14 NOTE — Therapy (Signed)
[place], [place], [address] Phone: [phone]   Fax:     Modified Barium Swallow  Patient Details  Name: Katelyn Gregory MRN: [id number] Date of Birth: Mar 20, 1926 No data recorded  Encounter Date: 04/14/2018  End of Session - 04/14/18 1323    Visit Number  1    Number of Visits  1    Date for SLP Re-Evaluation  04/14/18    SLP Start Time  3875    SLP Stop Time   1324    SLP Time Calculation (min)  60 min    Activity Tolerance  Patient tolerated treatment well       Past Medical History:  Diagnosis Date  . Cancer ([place])    skin  . Depression   . Diabetes mellitus type 2, uncomplicated ([place])   . GERD (gastroesophageal reflux disease)   . Hyperlipidemia   . Hypertension   . Osteoarthritis   . Osteoporosis   . Renal disorder     Past Surgical History:  Procedure Laterality Date  . TOTAL HIP ARTHROPLASTY      There were no vitals filed for this visit.   Subjective: Patient behavior: (alertness, ability to follow instructions, etc.):  The patient is a poor historian but follows simple directions.  Chief complaint: The patient reports "every morning I heave when I eat" "sometimes I spit up something white-I don't know what that is" "sometimes it comes back up"   Objective:  Radiological Procedure: A videoflouroscopic evaluation of Katelyn-preparatory, reflex initiation, and pharyngeal phases of the swallow was performed; as well as a screening of the upper esophageal phase.  I. POSTURE: Upright in MBS chair  II. VIEW: Lateral  III. COMPENSATORY STRATEGIES: N/A  IV. BOLUSES ADMINISTERED:   Thin Liquid: 2 self-administered cup rim   Nectar-thick Liquid: 1 self-administered cup rim   Honey-thick Liquid: DNT   Puree: 2 teaspoon presentations   Mechanical Soft: 1/4 graham cracker in applesauce  V. RESULTS OF EVALUATION: A. Katelyn PREPARATORY PHASE: (The lips, tongue, and velum  are observed for strength and coordination)       **Overall Severity Rating: Mild; slow, disorganized Katelyn management, slow but adequate mastication of  graham cracker in [place] sauce, pre-swallow spill of liquids  B. SWALLOW INITIATION/REFLEX: (The reflex is normal if "triggered" by the time the bolus reached the base of the tongue)  **Overall Severity Rating: Minimal; triggers at the valleculae  C. PHARYNGEAL PHASE: (Pharyngeal function is normal if the bolus shows rapid, smooth, and continuous transit through the pharynx and there is no pharyngeal residue after the swallow)  **Overall Severity Rating: Mild; reduced pharyngeal pressure generation, trace pharyngeal residue  D. LARYNGEAL PENETRATION: (Material entering into the laryngeal inlet/vestibule but not aspirated) TRANSIENT with thin liquids  E. ASPIRATION: NONE  F. ESOPHAGEAL PHASE: (Screening of the upper esophagus) Unable to view due to positioning  ASSESSMENT: This 85 year old nursing home resident is presenting with mild oropharyngeal dysphagia characterized by slow, disorganized Katelyn management, slow but adequate mastication of  graham cracker in [place] sauce, pre-swallow spill of liquids, delayed pharyngeal swallow initiation, reduced pharyngeal pressure generation, trace pharyngeal residue, and transient/flash laryngeal penetration.  There is no observed tracheal aspiration.  The patient does not appear to be at significant risk for prandial aspiration.  The patient appears to be most concerned with gagging and regurgitation while eating and is reporting no significant indicators of aspiration- denies coughing or strangling while drinking or eating.  PLAN/RECOMMENDATIONS:   A. Diet: Mechanical soft   B. Swallowing Precautions: stringent Katelyn care after meals, stay upright for 90 minutes after meals   C. Recommended consultation to: GI   D. Therapy recommendations: not indicated   E. Results and recommendations were  discussed with the patient immediately following the study and the final report routed to the referring MD and faxed to nursing home.   Oropharyngeal dysphagia - Plan: DG SWALLOW FUNC OP MEDICARE SPEECH PATH, DG SWALLOW FUNC OP MEDICARE SPEECH PATH        Problem List Patient Active Problem List   Diagnosis Date Noted  . Protein-calorie malnutrition, severe ([place]) 03/14/2018  . Emphysema/COPD ([place]) 03/14/2018  . Pulmonary fibrosis ([place]) 03/14/2018  . Degenerative arthritis of thumb, left 03/04/2018  . Benign essential hypertension 03/04/2018  . Uncontrolled type 2 diabetes mellitus without complication, with long-term current use of insulin ([place]) 01/29/2018  . Chronic bilateral back pain 12/18/2017  . Nausea 12/13/2017  . Chronic anemia 10/21/2017  . Bilateral lower extremity edema 10/04/2017  . Dyslipidemia associated with type 2 diabetes mellitus ([place]) 09/17/2017  . Gastroesophageal reflux disease without esophagitis 09/17/2017  . Controlled type 2 diabetes mellitus without complication, without long-term current use of insulin ([place]) 09/17/2017  . Chronic constipation 09/17/2017  . Chronic allergic rhinitis 09/17/2017  . Depression with anxiety 09/17/2017   Leroy Sea, MS/CCC- SLP  Lou Miner 04/14/2018, 1:24 PM  [facility] [place], [place], [address] Phone: [phone]   Fax:     Name: Katelyn Gregory MRN: [id number] Date of Birth: 1926/04/22

## 2019-02-16 DEATH — deceased

## 2019-04-29 IMAGING — US US RENAL
1 series · 14 of 25 positions shown · non-contrast
Comparison: None.

CLINICAL DATA: Acute renal failure

EXAM:
RENAL / URINARY TRACT ULTRASOUND COMPLETE

[Series 1: us renal · 14 of 32 slices shown]
[im 1/32]
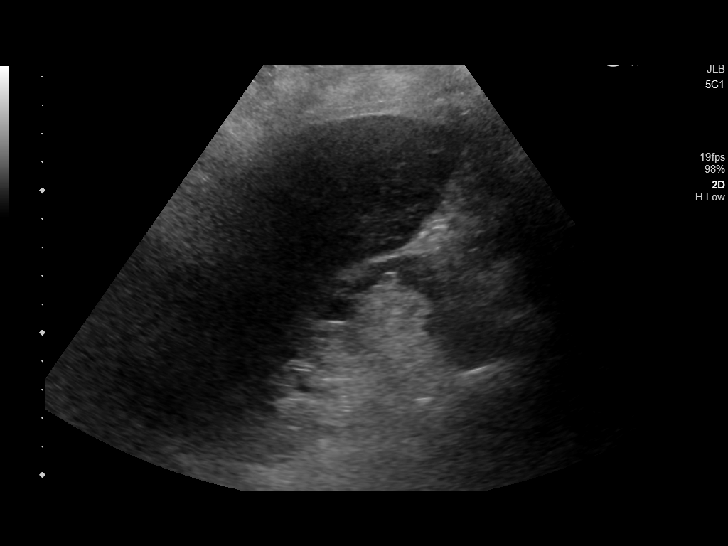
[im 3/32]
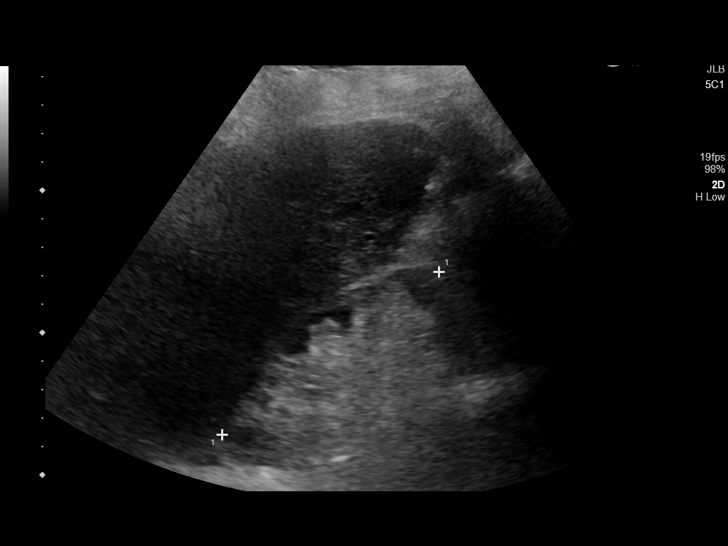
[im 6/32]
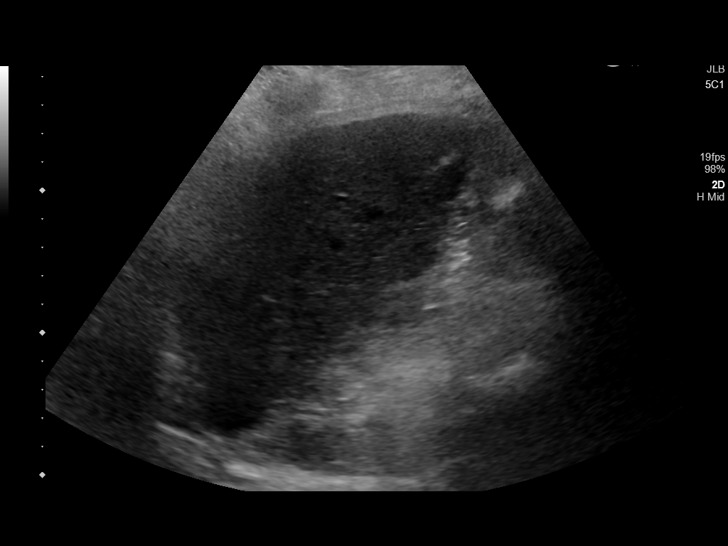
[im 8/32]
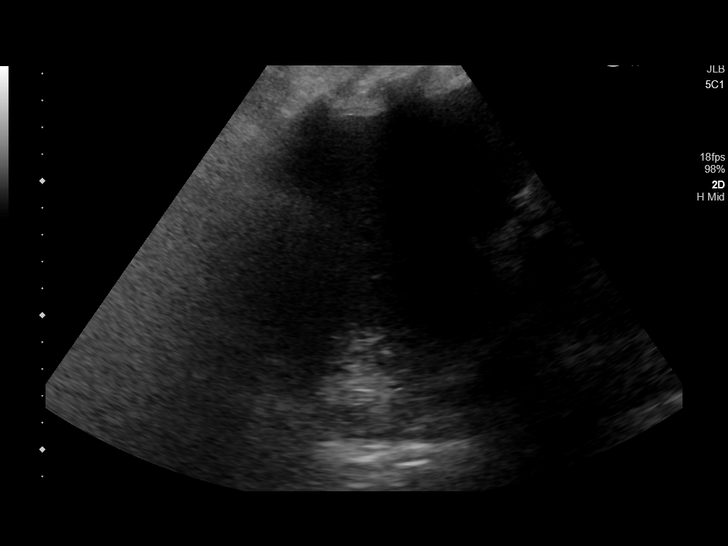
[im 11/32]
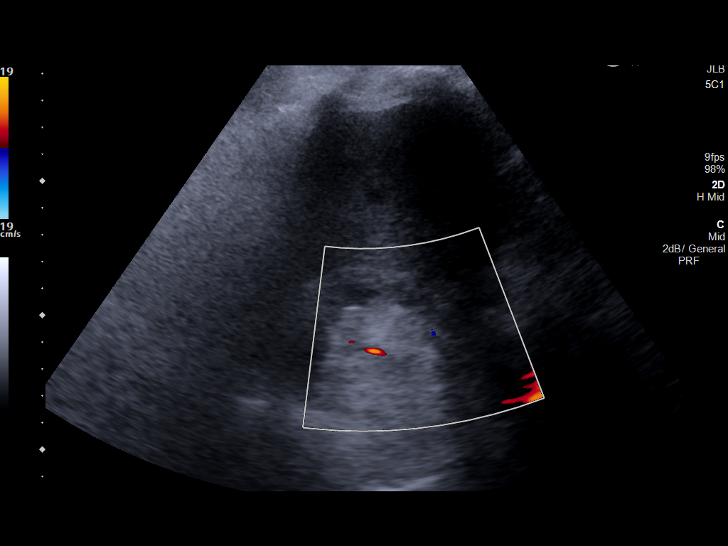
[im 12/32]
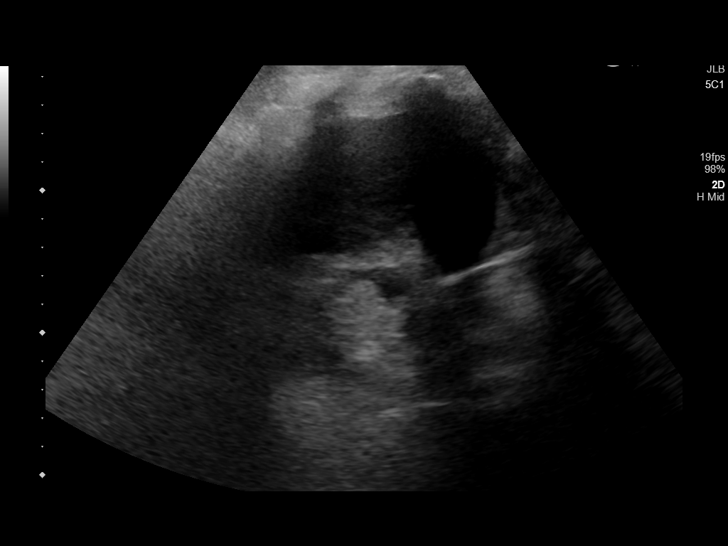
[im 15/32]
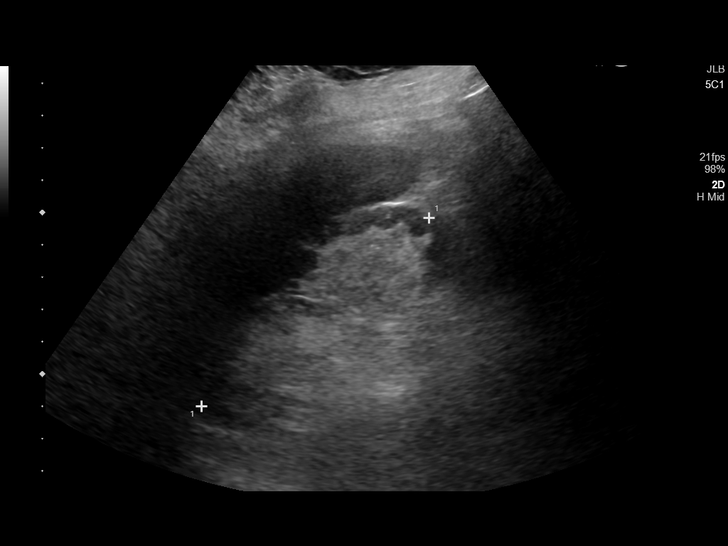
[im 17/32]
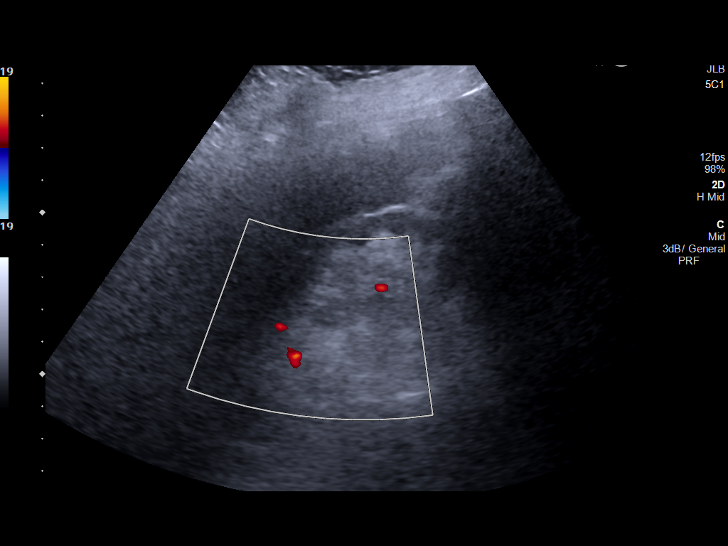
[im 20/32]
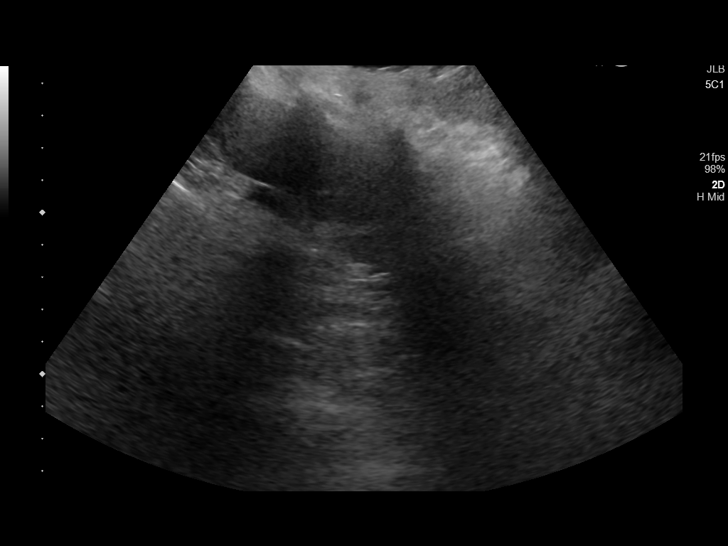
[im 21/32]
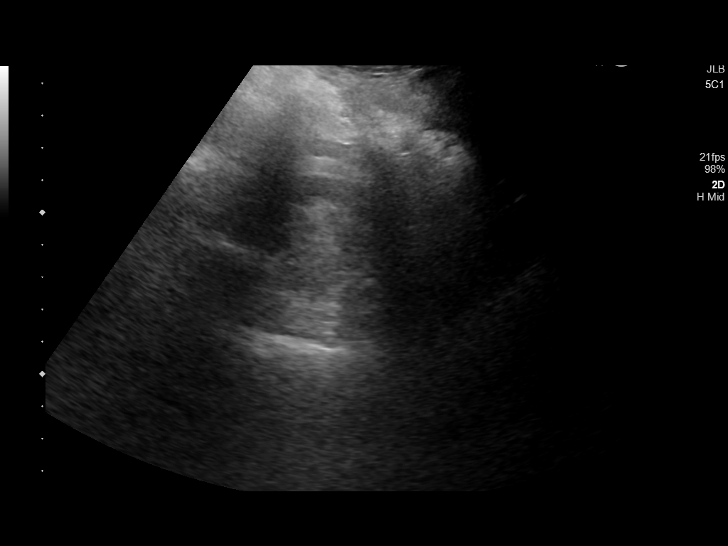
[im 24/32]
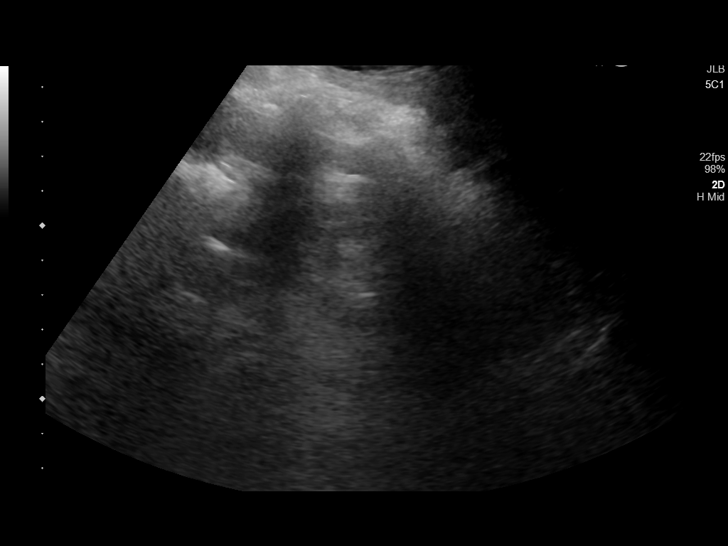
[im 26/32]
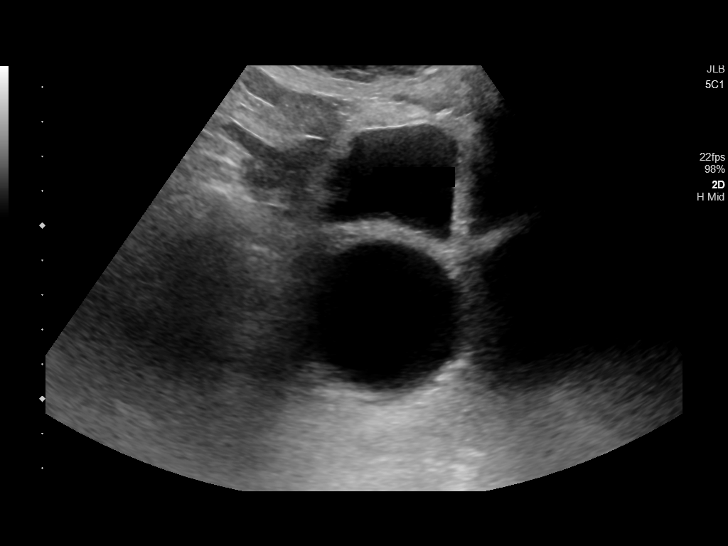
[im 29/32]
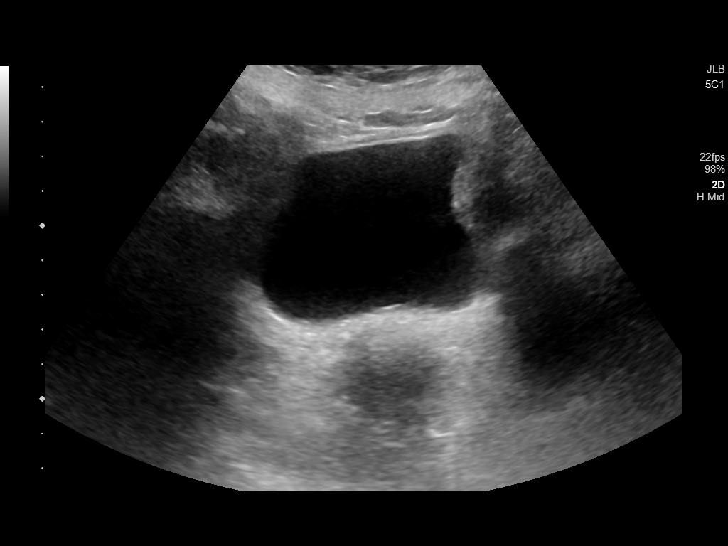
[im 32/32]
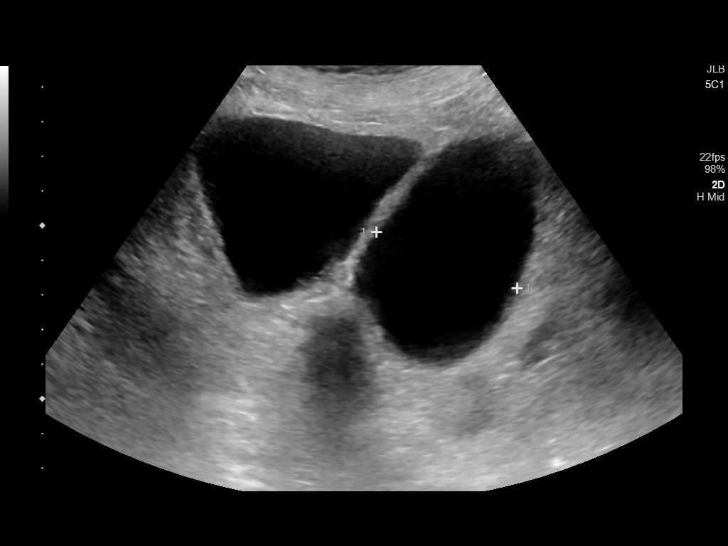

[14 of 25 positions shown; findings below may reference images not displayed]

FINDINGS: Right Kidney:

Length: 9.5 cm. Diffusely thin cortex. Echogenicity of the cortex is
within normal limits without mass or cyst. No hydronephrosis.

Left Kidney:

Length: 9.1 cm. Diffusely thin cortex. Echogenicity of the cortex is
within normal limits without mass or cyst. No hydronephrosis.

Bladder:

Appears normal for degree of bladder distention. There is an
additional simple appearing cystic structure within the pelvis,
immediately adjacent to the bladder, measuring approximately 4.4 cm
greatest dimension, of uncertain etiology.
IMPRESSION: 1. Both kidneys with diffusely thin cortices suggesting chronic
medical renal disease and/or chronic renovascular disease.
2. No acute findings.  No hydronephrosis.
3. Simple appearing cystic structure within the lower pelvis,
adjacent to the bladder, measuring approximately 4.4 cm, of
uncertain etiology but favored to be benign. Consider nonemergent CT
of the pelvis for further characterization.

## 2019-12-16 IMAGING — RF DG SWALLOWING FUNCTION
1 series · 1 of 1 positions shown · non-contrast
Comparison: Chest x-ray 08/24/2017.

CLINICAL DATA: Dysphagia.

EXAM:
MODIFIED BARIUM SWALLOW
TECHNIQUE: Different consistencies of barium were administered orally to the
patient by the Speech Pathologist. Imaging of the pharynx was
performed in the lateral projection. The radiologist was present in
the fluoroscopy room for this study, providing personal supervision.
FLUOROSCOPY TIME:  Fluoroscopy Time:  3 minutes 12 seconds
Number of Acquired Spot Images: Video loops obtained.

[Series 1: cp_standard · 0.17mm/px · 1 of 1 slices shown]
[im 1/1]
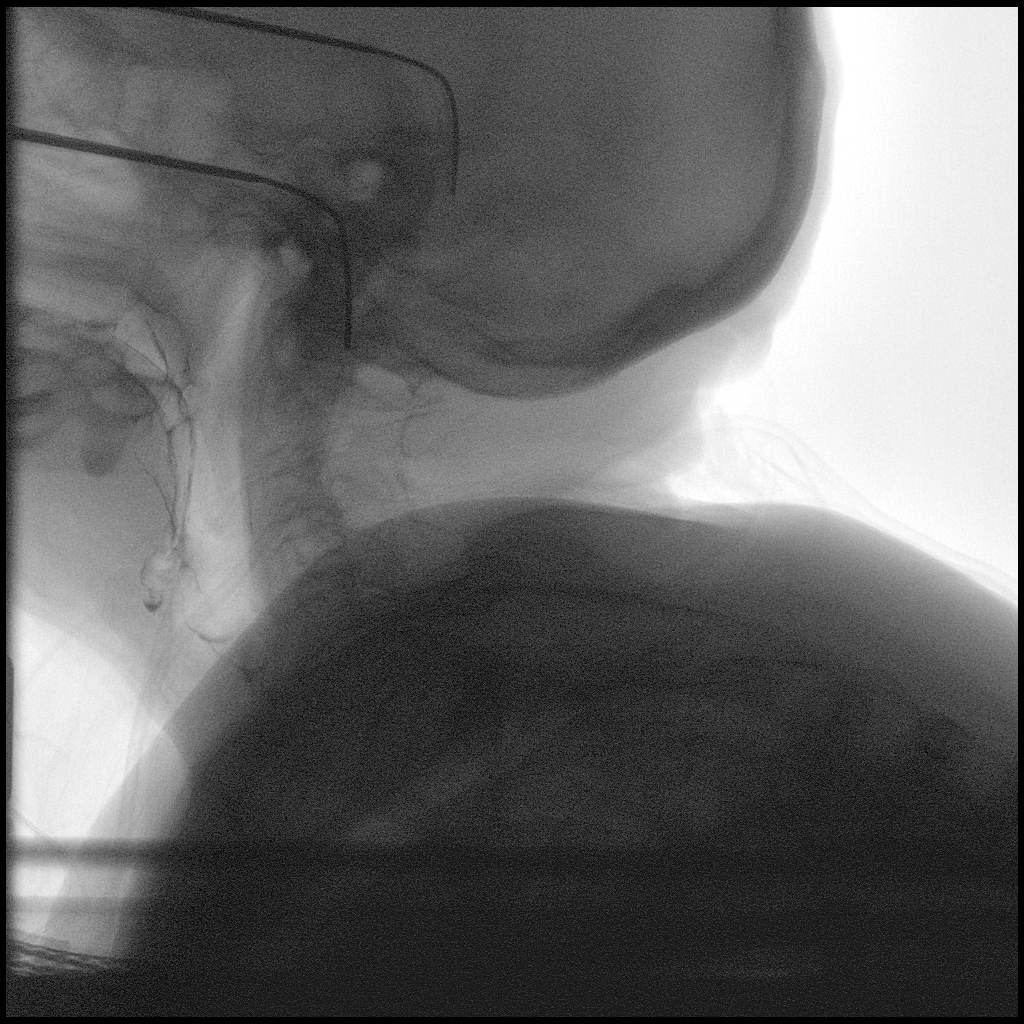

[1 of 1 positions shown; findings below may reference images not displayed]

FINDINGS: Transient penetration noted with thin liquid. No other abnormality
noted. Patient swallowed nectar,puree, puree with cracker without
difficulty or aspiration.
IMPRESSION: Transient penetration with thin liquids. Exam otherwise negative. No
evidence of aspiration.

Please refer to the Speech Pathologists report for complete details
and recommendations.
